# Patient Record
Sex: Female | Born: 1985 | Race: White | Hispanic: No | Smoking: Current every day smoker
Health system: Southern US, Community
[De-identification: ages and names within clinical notes are randomized; demographics above are authoritative.]

## PROBLEM LIST (undated history)

## (undated) DIAGNOSIS — F4310 Post-traumatic stress disorder, unspecified: Secondary | ICD-10-CM

## (undated) DIAGNOSIS — F419 Anxiety disorder, unspecified: Secondary | ICD-10-CM

## (undated) DIAGNOSIS — M549 Dorsalgia, unspecified: Secondary | ICD-10-CM

## (undated) DIAGNOSIS — G43909 Migraine, unspecified, not intractable, without status migrainosus: Secondary | ICD-10-CM

## (undated) DIAGNOSIS — K589 Irritable bowel syndrome without diarrhea: Secondary | ICD-10-CM

## (undated) DIAGNOSIS — F1990 Other psychoactive substance use, unspecified, uncomplicated: Secondary | ICD-10-CM

## (undated) DIAGNOSIS — F329 Major depressive disorder, single episode, unspecified: Secondary | ICD-10-CM

## (undated) DIAGNOSIS — G8929 Other chronic pain: Secondary | ICD-10-CM

## (undated) DIAGNOSIS — F319 Bipolar disorder, unspecified: Secondary | ICD-10-CM

## (undated) DIAGNOSIS — J45909 Unspecified asthma, uncomplicated: Secondary | ICD-10-CM

## (undated) DIAGNOSIS — G479 Sleep disorder, unspecified: Secondary | ICD-10-CM

## (undated) DIAGNOSIS — F32A Depression, unspecified: Secondary | ICD-10-CM

## (undated) DIAGNOSIS — G5600 Carpal tunnel syndrome, unspecified upper limb: Secondary | ICD-10-CM

## (undated) HISTORY — DX: Irritable bowel syndrome without diarrhea: K58.9

## (undated) HISTORY — DX: Carpal tunnel syndrome, unspecified upper limb: G56.00

## (undated) HISTORY — PX: LITHOTRIPSY: SUR834

## (undated) HISTORY — DX: Other psychoactive substance use, unspecified, uncomplicated: F19.90

## (undated) HISTORY — PX: TONSILLECTOMY: SUR1361

---

## 2013-10-30 ENCOUNTER — Emergency Department (HOSPITAL_COMMUNITY)
Admission: EM | Admit: 2013-10-30 | Discharge: 2013-10-30 | Disposition: A | Discharge status: Home / Self Care | Payer: Self-pay | Attending: Emergency Medicine | Admitting: Emergency Medicine

## 2013-10-30 ENCOUNTER — Encounter (HOSPITAL_COMMUNITY): Payer: Self-pay | Admitting: Emergency Medicine

## 2013-10-30 DIAGNOSIS — F17200 Nicotine dependence, unspecified, uncomplicated: Secondary | ICD-10-CM | POA: Insufficient documentation

## 2013-10-30 DIAGNOSIS — F1110 Opioid abuse, uncomplicated: Secondary | ICD-10-CM | POA: Insufficient documentation

## 2013-10-30 DIAGNOSIS — F1310 Sedative, hypnotic or anxiolytic abuse, uncomplicated: Secondary | ICD-10-CM | POA: Insufficient documentation

## 2013-10-30 DIAGNOSIS — F1910 Other psychoactive substance abuse, uncomplicated: Secondary | ICD-10-CM

## 2013-10-30 DIAGNOSIS — Z79899 Other long term (current) drug therapy: Secondary | ICD-10-CM | POA: Insufficient documentation

## 2013-10-30 LAB — COMPREHENSIVE METABOLIC PANEL
ALBUMIN: 3.8 g/dL (ref 3.5–5.2)
ALT: 14 U/L (ref 0–35)
AST: 19 U/L (ref 0–37)
Alkaline Phosphatase: 66 U/L (ref 39–117)
BUN: 13 mg/dL (ref 6–23)
CHLORIDE: 101 meq/L (ref 96–112)
CO2: 24 meq/L (ref 19–32)
CREATININE: 0.9 mg/dL (ref 0.50–1.10)
Calcium: 9.2 mg/dL (ref 8.4–10.5)
GFR calc Af Amer: >90 mL/min (ref >90)
GFR calc non Af Amer: 86 mL/min — ABNORMAL LOW (ref >90)
Glucose, Bld: 52 mg/dL — ABNORMAL LOW (ref 70–99)
Potassium: 3.8 mEq/L (ref 3.7–5.3)
Sodium: 139 mEq/L (ref 137–147)
Total Protein: 7.6 g/dL (ref 6.0–8.3)

## 2013-10-30 LAB — CBC WITH DIFFERENTIAL/PLATELET
Basophils Absolute: 0 10*3/uL (ref 0.0–0.1)
Basophils Relative: 0 % (ref 0–1)
EOS ABS: 0.3 10*3/uL (ref 0.0–0.7)
EOS PCT: 3 % (ref 0–5)
HCT: 36.8 % (ref 36.0–46.0)
Hemoglobin: 12.4 g/dL (ref 12.0–15.0)
LYMPHS PCT: 29 % (ref 12–46)
Lymphs Abs: 2.9 10*3/uL (ref 0.7–4.0)
MCH: 31.4 pg (ref 26.0–34.0)
MCHC: 33.7 g/dL (ref 30.0–36.0)
MCV: 93.2 fL (ref 78.0–100.0)
Monocytes Absolute: 0.8 10*3/uL (ref 0.1–1.0)
Monocytes Relative: 8 % (ref 3–12)
Neutro Abs: 6 10*3/uL (ref 1.7–7.7)
Neutrophils Relative %: 60 % (ref 43–77)
Platelets: 300 10*3/uL (ref 150–400)
RBC: 3.95 MIL/uL (ref 3.87–5.11)
RDW: 14 % (ref 11.5–15.5)
WBC: 10 10*3/uL (ref 4.0–10.5)

## 2013-10-30 LAB — RAPID URINE DRUG SCREEN, HOSP PERFORMED
Amphetamines: NOT DETECTED
Barbiturates: NOT DETECTED
Benzodiazepines: NOT DETECTED
COCAINE: NOT DETECTED
OPIATES: NOT DETECTED
Tetrahydrocannabinol: NOT DETECTED

## 2013-10-30 LAB — CBG MONITORING, ED: GLUCOSE-CAPILLARY: 99 mg/dL (ref 70–99)

## 2013-10-30 LAB — ETHANOL

## 2013-10-30 MED ORDER — CLONIDINE HCL 0.1 MG PO TABS
0.1000 mg | ORAL_TABLET | Freq: Once | ORAL | Status: AC
  Administered 2013-10-30: 0.1 mg via ORAL
  Filled 2013-10-30: qty 1

## 2013-10-30 NOTE — ED Provider Notes (Addendum)
CSN: [id number]     Arrival date & time 10/30/13  1110 History   First MD Initiated Contact with Patient 10/30/13 1342     Chief Complaint  Patient presents with  . V70.1     (Consider location/radiation/quality/duration/timing/severity/associated sxs/prior Treatment) The history is provided by the patient.   28 year old female sent in by day Dominique Bennett for detox screening and medical clearance. Patient's had a long history of abusing opioid-type pills and benzodiazepines. Patient currently is abusing Xanax and suboccipital. However that has a Narcan component to it so not sure how she would have withdrawal from that. Patient recently was in detox [facility]. Patient currently without any symptoms last used yesterday. Patient is very worried about getting sick states to get sick when she stops. Patient denies any suicidal or homicidal ideation.  History reviewed. No pertinent past medical history. Past Surgical History  Procedure Laterality Date  . Tonsillectomy    . Nephrectomy transplanted organ    . Cesarean section     History reviewed. No pertinent family history. History  Substance Use Topics  . Smoking status: Current Every Day Smoker -- 2.00 packs/day    Types: Cigarettes  . Smokeless tobacco: Not on file  . Alcohol Use: No   OB History   Grav Para Term Preterm Abortions TAB SAB Ect Mult Living                 Review of Systems  Constitutional: Negative for fever.  HENT: Negative for congestion.   Eyes: Negative for visual disturbance.  Respiratory: Negative for shortness of breath.   Cardiovascular: Negative for chest pain.  Gastrointestinal: Negative for nausea, vomiting, abdominal pain and diarrhea.  Genitourinary: Negative for dysuria.  Musculoskeletal: Negative for back pain.  Neurological: Negative for headaches.  Hematological: Does not bruise/bleed easily.  Psychiatric/Behavioral: Negative for confusion.      Allergies  Cymbalta; Other;  and Tramadol  Home Medications   Prior to Admission medications   Medication Sig Start Date End Date Taking? Authorizing Provider  ALPRAZolam Prudy Feeler(XANAX) 1 MG tablet Take 1 mg by mouth 3 (three) times daily as needed for anxiety.   Yes Historical Provider, MD  Buprenorphine HCl-Naloxone HCl (SUBOXONE) 4-1 MG FILM Place under the tongue.   Yes Historical Provider, MD   BP 118/70  Pulse 89  Temp(Src) 97.8 F (36.6 C) (Oral)  Ht 5\' 5"  (1.651 m)  Wt 145 lb (65.772 kg)  BMI 24.13 kg/m2  SpO2 97%  LMP 10/25/2013 Physical Exam  Nursing note and vitals reviewed. Constitutional: She is oriented to person, place, and time. She appears well-developed and well-nourished. No distress.  HENT:  Head: Normocephalic and atraumatic.  Eyes: Conjunctivae are normal. Pupils are equal, round, and reactive to light.  Neck: Normal range of motion.  Cardiovascular: Normal rate, regular rhythm and normal heart sounds.   No murmur heard. Pulmonary/Chest: Effort normal and breath sounds normal. No respiratory distress.  Abdominal: Soft. Bowel sounds are normal. There is no tenderness.  Musculoskeletal: Normal range of motion. She exhibits no edema.  Neurological: She is oriented to person, place, and time. No cranial nerve deficit. She exhibits normal muscle tone. Coordination normal.  Skin: Skin is warm. No rash noted.    ED Course  Procedures (including critical care time) Labs Review Labs Reviewed  COMPREHENSIVE METABOLIC PANEL - Abnormal; Notable for the following:    Glucose, Bld 52 (*)    Total Bilirubin <0.2 (*)    GFR calc  non Af Amer 86 (*)    All other components within normal limits  URINE RAPID DRUG SCREEN (HOSP PERFORMED)  CBC WITH DIFFERENTIAL  ETHANOL   Results for orders placed during the hospital encounter of 10/30/13  URINE RAPID DRUG SCREEN (HOSP PERFORMED)      Result Value Ref Range   Opiates NONE DETECTED  NONE DETECTED   Cocaine NONE DETECTED  NONE DETECTED    Benzodiazepines NONE DETECTED  NONE DETECTED   Amphetamines NONE DETECTED  NONE DETECTED   Tetrahydrocannabinol NONE DETECTED  NONE DETECTED   Barbiturates NONE DETECTED  NONE DETECTED  COMPREHENSIVE METABOLIC PANEL      Result Value Ref Range   Sodium 139  137 - 147 mEq/L   Potassium 3.8  3.7 - 5.3 mEq/L   Chloride 101  96 - 112 mEq/L   CO2 24  19 - 32 mEq/L   Glucose, Bld 52 (*) 70 - 99 mg/dL   BUN 13  6 - 23 mg/dL   Creatinine, Ser 1.610.90  0.50 - 1.10 mg/dL   Calcium 9.2  8.4 - 09.610.5 mg/dL   Total Protein 7.6  6.0 - 8.3 g/dL   Albumin 3.8  3.5 - 5.2 g/dL   AST 19  0 - 37 U/L   ALT 14  0 - 35 U/L   Alkaline Phosphatase 66  39 - 117 U/L   Total Bilirubin <0.2 (*) 0.3 - 1.2 mg/dL   GFR calc non Af Amer 86 (*) >90 mL/min   GFR calc Af Amer >90  >90 mL/min  CBC WITH DIFFERENTIAL      Result Value Ref Range   WBC 10.0  4.0 - 10.5 K/uL   RBC 3.95  3.87 - 5.11 MIL/uL   Hemoglobin 12.4  12.0 - 15.0 g/dL   HCT 04.536.8  40.936.0 - 81.146.0 %   MCV 93.2  78.0 - 100.0 fL   MCH 31.4  26.0 - 34.0 pg   MCHC 33.7  30.0 - 36.0 g/dL   RDW 91.414.0  78.211.5 - 95.615.5 %   Platelets 300  150 - 400 K/uL   Neutrophils Relative % 60  43 - 77 %   Neutro Abs 6.0  1.7 - 7.7 K/uL   Lymphocytes Relative 29  12 - 46 %   Lymphs Abs 2.9  0.7 - 4.0 K/uL   Monocytes Relative 8  3 - 12 %   Monocytes Absolute 0.8  0.1 - 1.0 K/uL   Eosinophils Relative 3  0 - 5 %   Eosinophils Absolute 0.3  0.0 - 0.7 K/uL   Basophils Relative 0  0 - 1 %   Basophils Absolute 0.0  0.0 - 0.1 K/uL  ETHANOL      Result Value Ref Range   Alcohol, Ethyl (B) <11  0 - 11 mg/dL     Imaging Review No results found.   EKG Interpretation None      MDM   Final diagnoses:  Polysubstance abuse    Patient sent in by day Dominique Bennett for a medical screening to go to detox program. Patient has been abusing opioid type medications and benzodiazepines for a long time. Patient recently in [facility] about 3 weeks ago. Patient states  she's abusing Xanax and suboxone.   We'll have behavioral health talk to the patient. She has been medically cleared. Patient was requesting some clonidine last used yesterday. No suicidal or homicidal thoughts.    Vanetta MuldersScott Santa Abdelrahman, MD 10/30/13 1454  Vanetta Mulders, MD 10/30/13 [phone]
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
## 2013-10-30 NOTE — Discharge Instructions (Signed)
°Emergency Department Resource Guide °1) Find a Doctor and Pay Out of Pocket °Although you won't have to find out who is covered by your insurance plan, it is a good idea to ask around and get recommendations. You will then need to call the office and see if the doctor you have chosen will accept you as a new patient and what types of options they offer for patients who are self-pay. Some doctors offer discounts or will set up payment plans for their patients who do not have insurance, but you will need to ask so you aren't surprised when you get to your appointment. ° °2) Contact Your Local Health Department °Not all health departments have doctors that can see patients for sick visits, but many do, so it is worth a call to see if yours does. If you don't know where your local health department is, you can check in your phone book. The CDC also has a tool to help you locate your state's health department, and many state websites also have listings of all of their local health departments. ° °3) Find a Walk-in Clinic °If your illness is not likely to be very severe or complicated, you may want to try a walk in clinic. These are popping up all over the country in pharmacies, drugstores, and shopping centers. They're usually staffed by nurse practitioners or physician assistants that have been trained to treat common illnesses and complaints. They're usually fairly quick and inexpensive. However, if you have serious medical issues or chronic medical problems, these are probably not your best option. ° °No Primary Care Doctor: °- Call [facility] at  832-8000 - they can help you locate a primary care doctor that  accepts your insurance, provides certain services, etc. °- Physician Referral Service- 1-800-533-3463 ° °Chronic Pain Problems: °Organization         Address  Phone   Notes  °[facility]  (336) 297-2271 Patients need to be referred by their primary care doctor.  ° °Medication  Assistance: °Organization         Address  Phone   Notes  °[place] [facility] [address] °[place], [place] [address] (336) 641-8030 --Must be a resident of [place] °-- Must have NO insurance coverage whatsoever (no Medicaid/ Medicare, etc.) °-- The pt. MUST have a primary care doctor that directs their care regularly and follows them in the community °  °[facility]  (866) 331-1348   °[employer]  (888) 892-1162   ° °Agencies that provide inexpensive medical care: °Organization         Address  Phone   Notes  °[facility]  (336) 832-8035   °[facility]    (336) 832-7272   °[facility] [address] °[place], [place] [address] (336) 832-4777   °[facility] [address], °[place] (336) 271-4999   °[facility]    (336) 373-0678   °[facility]    (336) 272-1050   °[facility] ° [address], [place] Phone:  (336) 832-4444, Fax:  (336) 832-4440 Hours of Operation:  9 am - 6 pm, M-F.  Also accepts Medicaid/Medicare and self-pay.  °[facility] ° [address],  Phone: (336) 832-3150, Fax: (336) 832-3151. Hours of Operation:  8:30 am - 5:30 pm, M-F.  Also accepts Medicaid and self-pay.  °[facility] [place] [address], [place] Phone: (336) 878-6027   °[facility] [address], [place], [place] (336)723-1848, Ext. 123 Mondays & Thursdays: 7-9 AM.  First 15 patients are seen on a first come, first serve basis. °  ° °Medicaid-accepting [place] Providers: ° °Organization         Address  Phone   Notes  °[facility] [address], [place] (336) 641-2100 Also accepts self-pay patients.  °[facility] [address], [place] ° (336) 856-9996   °[facility] [address], [place]  (336) 288-8857   °[facility] [address] [place] [address], [place] (336) 299-7000   °Veita Bland [address], [place]  ° (336) 373-1557 Only accepts [place] Access Medicaid patients after they have their name applied to their card.  ° °Self-Pay (no insurance) in [place]: ° °Organization         Address  Phone   Notes  °Sickle Cell Patients, [facility] [address], [place] (336) 832-1970   °[facility] [address], [place] (336) 832-4400   °[facility] [place] ° [address],  (336) 992-4800   °[facility]/Dr. Osei-Bonsu ° [address] [place] [address], [place] or [address], [place] (336) 841-8500 Phone number for both [place] and [place] locations is the same.  °[facility] [address], [place] (336) 299-0000   °[facility] [address] [place] [address], [place] or [address] (336) 852-7530 °(336) 878-2260   °[facility] [address], [place] (336) 350-1642, phone; (336) 294-5005, fax Sees patients 1st and 3rd Saturday of every month.  Must not qualify for public or private insurance (i.e. Medicaid, Medicare, [place] Health Choice, Veterans' Benefits) • Household income should be no more than 200% of the poverty level •The clinic cannot treat you if you are pregnant or think you are pregnant • Sexually transmitted diseases are not treated at the clinic.  ° ° °Dental Care: °Organization         Address  Phone  Notes  °[place] [facility] [address],  (336) 641-6152 Accepts children up to age 21 who are enrolled in Medicaid or [place] Health Choice; pregnant women with a Medicaid card; and children who have applied for Medicaid or [place] Health Choice, but were declined, whose parents can pay a reduced fee at time of service.  °[place]  [facility] [place]  [address], [place] (336) 641-7733 Accepts children up to age 21 who are enrolled in Medicaid or [place] Health Choice; pregnant women with a Medicaid card; and children who have applied for Medicaid or [place] Health Choice, but were declined, whose parents can pay a reduced fee at time of service.  °[facility] ° [address],  (336) 641-4533 Patients are seen by appointment only. Walk-ins are not accepted. [facility] will see patients 18 years of age and older. °Monday - Tuesday (8am-5pm) °Most Wednesdays (8:30-5pm) °$30 per visit, cash only  °[facility] ° [address], [place] (336) 641-4533 Patients are seen by appointment only. Walk-ins are not accepted. [facility] will see patients 18 years of age and older. °One   Wednesday Evening (Monthly: Volunteer Based).  $30 per visit, cash only  °[facility]  (919) 537-3737 for adults; Children under age 4, call Graduate Pediatric Dentistry at ([phone]) [phone]. Children aged 4-14, please call (919) [phone] to request a pediatric application. ° Dental services are provided in all areas of dental care including fillings, crowns and bridges, complete and partial dentures, implants, gum treatment, root canals, and extractions. Preventive care is also provided. Treatment is provided to both adults and children. °Patients are selected via a lottery and there is often a waiting list. °  °[facility] [address], °[place] ° (336) 763-8833 [email or web address] °  °[facility] [address], [place], [place] (336)723-1848, Ext. 123 Second and Fourth Thursday of each month, opens at 6:30 AM; Clinic ends at 9 AM.  Patients are seen on a first-come first-served basis, and a limited number are seen during each clinic.  ° °[facility] ° [address], [place], [place] (336) 723-7904    Eligibility Requirements °You must have lived in [place], [place], or [place] counties for at least the last three months. °  You cannot be eligible for state or federal sponsored healthcare insurance, including Veterans Administration, Medicaid, or Medicare. °  You generally cannot be eligible for healthcare insurance through your employer.  °  How to apply: °Eligibility screenings are held every Tuesday and Wednesday afternoon from 1:00 pm until 4:00 pm. You do not need an appointment for the interview!  °[facility] [address], [place], [place] 336-631-2330   °[facility] [facility]  336-342-8273   °[place]  336-703-3100   °[facility]  336-570-6415   ° °Behavioral Health Resources in the Community: °Intensive Outpatient Programs °Organization         Address  Phone  Notes  °[facility] [address], [place], [place] 336-878-6098   °[facility] [address], [place], [place] 336-832-9800   °[facility] [address], [place], [place] ° 336-882-2125   °[facility] [address],  °[place], [place] 1-800-853-5163 or 336-641-4981   °Substance Abuse Resources °Organization         Address  Phone  Notes  °[facility]  336-882-2125   °[facility]  336-784-9470   °[facility]  336-285-9073   °[facility]  336-845-3988   °Residential & Outpatient Substance Abuse Program  1-800-659-3381   °Psychological Services °Organization         Address  Phone  Notes  ° Health  336- 832-9600   °[facility]  336- 378-7881   °[facility] [address], [place] 1-800-853-5163 or 336-641-4981   ° °Mobile Crisis Teams °Organization         Address  Phone  Notes  °[facility], Mobile Crisis Care Unit  1-877-626-1772   °Assertive °[facility] ° [address]  [place], [place] 336-834-9664   °Sharon DeEsch [address] °[place] 336-554-5454   ° °Self-Help/Support Groups °Organization         Address  Phone             Notes  °[facility]  - variety of support groups  336- 373-1402 Call for more information  °Narcotics Anonymous (NA), [facility] [address], °[place]  2 meetings at this location  ° °  Residential Treatment Programs °Organization         Address  Phone  Notes  °[facility] [address],    °[place]  1-866-801-8205   °[facility] ° [address], [place], [place] 704-293-8524   °[facility] [address], [place] 336-845-3988 Admissions: 8am-3pm M-F  °[facility] [address]    °[place], [place] 336-841-1104   °[facility] [address], [place], [place] 336-379-7146   °[facility] [address]  °[place], [place] 336-285-9073   °[facility] - Intensive Outpatient [address], [place], [place] 336-852-3033   °[facility] [address]  °[place], [place] 1-877-615-2722 or 336-784-9470   °[facility] [address] [place], [place] 336-227-7417 Accepts Medicaid  °[facility] [address]  °[place] 1-800-659-3381 Substance Abuse/Addiction Treatment  ° °[place] Behavioral Health Resources °Organization         Address  Phone  Notes  °[facility]  (888) 581-9988   °Julie Brannon, PhD [address] [place], [place]   (336) 349-5553 or (336) 951-0000   °[facility]   [address] °[place], [place] (336) 349-4454   °[facility] [address], [place], [place] (336) 342-8316 Insurance/Medicaid/sponsorship through [facility]  °[facility] [address]                                    [place], [place] (336) 342-8316 Therapy/tele-psych/case    °[facility] [address]  ° [place], [place] (336) 349-2233    °Dr. Arfeen  (336) 349-4544   °[facility] [place]  [employer] [place] [facility] 1) [address], [place] °2) [address], [place] °3)  [address], [place] (336) 349-3220 °(336) 342-7768 ° °(336) 342-8140   °[place] [facility] (336) 342-1394 or (336) 342-3537 (After Hours)    ° ° °

## 2013-10-30 NOTE — BH Assessment (Signed)
Spoke with EDP Dr.Ray to obtain clinicals prior to assessing patient.  Glorious PeachNajah Jumaane [place]ford, MS, LCASA Assessment Counselor

## 2013-10-30 NOTE — BH Assessment (Signed)
Consulted with AC Shelbie Proctorina Lawson and Theotis Burrowonrad Winthrow,FNP who is declining patient for inpatient detox admission as patient does not meet inpatient criteria.   Pt is recommended to follow back up with [facility] so they can coordinate residential long term placement for patient. TTS informed patient of this and she is agreeable to follow-up with [facility].  Consulted with EDP Dr. Rosalia Hammersay and she is in agreement with discharging patient from ER with the [facility].   Glorious PeachNajah Presley, MS, LCASA Assessment Counselor

## 2013-10-30 NOTE — BH Assessment (Signed)
TTS assessment complete.  Najah Presley, MS, LCASA Assessment Counselor  

## 2013-10-30 NOTE — ED Notes (Signed)
Patient given discharge instruction, verbalized understand. Patient ambulatory out of the department. Returned pt belonging, and pickup wallet from security,

## 2013-10-30 NOTE — ED Notes (Signed)
Being xanax and suboxone

## 2013-10-30 NOTE — ED Notes (Signed)
Pt reports that her last drug use was yesterday. Pt reports taking 2 xanax and 2 suboxone. Pt denies taking any other drugs.

## 2013-10-30 NOTE — ED Notes (Signed)
Pt states she does not want anything for pain, she wants to go home, [facility]

## 2013-10-30 NOTE — ED Provider Notes (Signed)
28 year old female who presents with report of substance abuse. [facility] need for medical clearance. Medical clearance was done here and a psychiatric consult was obtained. The pill she does not meet inpatient criteria. They have spoken with her and reports that she understands and is given community resources for followup to  Hilario Quarryanielle S Ray, MD 10/30/13 1948

## 2014-02-16 ENCOUNTER — Emergency Department (HOSPITAL_COMMUNITY)
Admission: EM | Admit: 2014-02-16 | Discharge: 2014-02-16 | Disposition: A | Discharge status: Home / Self Care | Payer: Self-pay | Attending: Emergency Medicine | Admitting: Emergency Medicine

## 2014-02-16 ENCOUNTER — Encounter (HOSPITAL_COMMUNITY): Payer: Self-pay | Admitting: Emergency Medicine

## 2014-02-16 ENCOUNTER — Emergency Department (HOSPITAL_COMMUNITY): Payer: Self-pay

## 2014-02-16 DIAGNOSIS — R109 Unspecified abdominal pain: Secondary | ICD-10-CM

## 2014-02-16 DIAGNOSIS — J45909 Unspecified asthma, uncomplicated: Secondary | ICD-10-CM | POA: Insufficient documentation

## 2014-02-16 DIAGNOSIS — Z720 Tobacco use: Secondary | ICD-10-CM | POA: Insufficient documentation

## 2014-02-16 DIAGNOSIS — Z8679 Personal history of other diseases of the circulatory system: Secondary | ICD-10-CM | POA: Insufficient documentation

## 2014-02-16 DIAGNOSIS — Z87442 Personal history of urinary calculi: Secondary | ICD-10-CM | POA: Insufficient documentation

## 2014-02-16 DIAGNOSIS — N12 Tubulo-interstitial nephritis, not specified as acute or chronic: Secondary | ICD-10-CM | POA: Insufficient documentation

## 2014-02-16 DIAGNOSIS — G8929 Other chronic pain: Secondary | ICD-10-CM | POA: Insufficient documentation

## 2014-02-16 DIAGNOSIS — Z91040 Latex allergy status: Secondary | ICD-10-CM | POA: Insufficient documentation

## 2014-02-16 DIAGNOSIS — Z9889 Other specified postprocedural states: Secondary | ICD-10-CM | POA: Insufficient documentation

## 2014-02-16 DIAGNOSIS — Z3202 Encounter for pregnancy test, result negative: Secondary | ICD-10-CM | POA: Insufficient documentation

## 2014-02-16 HISTORY — DX: Dorsalgia, unspecified: M54.9

## 2014-02-16 HISTORY — DX: Migraine, unspecified, not intractable, without status migrainosus: G43.909

## 2014-02-16 HISTORY — DX: Unspecified asthma, uncomplicated: J45.909

## 2014-02-16 HISTORY — DX: Other chronic pain: G89.29

## 2014-02-16 LAB — URINALYSIS, ROUTINE W REFLEX MICROSCOPIC
Glucose, UA: NEGATIVE mg/dL
Ketones, ur: 15 mg/dL — AB
Leukocytes, UA: NEGATIVE
Nitrite: POSITIVE — AB
Protein, ur: 100 mg/dL — AB
Specific Gravity, Urine: >1.03 — ABNORMAL HIGH (ref 1.005–1.030)
Urobilinogen, UA: 0.2 mg/dL (ref 0.0–1.0)
pH: 6 (ref 5.0–8.0)

## 2014-02-16 LAB — CBC WITH DIFFERENTIAL/PLATELET
Basophils Absolute: 0 10*3/uL (ref 0.0–0.1)
Basophils Relative: 0 % (ref 0–1)
Eosinophils Absolute: 0 10*3/uL (ref 0.0–0.7)
Eosinophils Relative: 0 % (ref 0–5)
HCT: 40.3 % (ref 36.0–46.0)
Hemoglobin: 14 g/dL (ref 12.0–15.0)
Lymphocytes Relative: 19 % (ref 12–46)
Lymphs Abs: 2.1 10*3/uL (ref 0.7–4.0)
MCH: 33.4 pg (ref 26.0–34.0)
MCHC: 34.7 g/dL (ref 30.0–36.0)
MCV: 96.2 fL (ref 78.0–100.0)
Monocytes Absolute: 0.7 10*3/uL (ref 0.1–1.0)
Monocytes Relative: 6 % (ref 3–12)
Neutro Abs: 7.9 10*3/uL — ABNORMAL HIGH (ref 1.7–7.7)
Neutrophils Relative %: 75 % (ref 43–77)
Platelets: 297 10*3/uL (ref 150–400)
RBC: 4.19 MIL/uL (ref 3.87–5.11)
RDW: 12.6 % (ref 11.5–15.5)
WBC: 10.8 10*3/uL — ABNORMAL HIGH (ref 4.0–10.5)

## 2014-02-16 LAB — BASIC METABOLIC PANEL
Anion gap: 15 (ref 5–15)
BUN: 9 mg/dL (ref 6–23)
CALCIUM: 9.7 mg/dL (ref 8.4–10.5)
CO2: 24 mEq/L (ref 19–32)
Chloride: 100 mEq/L (ref 96–112)
Creatinine, Ser: 0.75 mg/dL (ref 0.50–1.10)
GFR calc Af Amer: >90 mL/min (ref >90)
GFR calc non Af Amer: >90 mL/min (ref >90)
GLUCOSE: 106 mg/dL — AB (ref 70–99)
Potassium: 3.2 mEq/L — ABNORMAL LOW (ref 3.7–5.3)
SODIUM: 139 meq/L (ref 137–147)

## 2014-02-16 LAB — URINE MICROSCOPIC-ADD ON

## 2014-02-16 LAB — PREGNANCY, URINE: PREG TEST UR: NEGATIVE

## 2014-02-16 MED ORDER — ONDANSETRON HCL 4 MG PO TABS: 4.0000 mg | ORAL_TABLET | Freq: Four times a day (QID) | ORAL | Status: DC

## 2014-02-16 MED ORDER — OXYCODONE-ACETAMINOPHEN 5-325 MG PO TABS
1.0000 | ORAL_TABLET | Freq: Once | ORAL | Status: AC
  Administered 2014-02-16: 1 via ORAL
  Filled 2014-02-16: qty 1

## 2014-02-16 MED ORDER — IBUPROFEN 800 MG PO TABS: 800.0000 mg | ORAL_TABLET | Freq: Three times a day (TID) | ORAL | Status: DC

## 2014-02-16 MED ORDER — MORPHINE SULFATE 4 MG/ML IJ SOLN
4.0000 mg | Freq: Once | INTRAMUSCULAR | Status: AC
  Administered 2014-02-16: 4 mg via INTRAVENOUS
  Filled 2014-02-16: qty 1

## 2014-02-16 MED ORDER — ONDANSETRON HCL 4 MG/2ML IJ SOLN
4.0000 mg | Freq: Once | INTRAMUSCULAR | Status: AC
  Administered 2014-02-16: 4 mg via INTRAVENOUS
  Filled 2014-02-16: qty 2

## 2014-02-16 MED ORDER — DEXTROSE 5 % IV SOLN
1.0000 g | Freq: Once | INTRAVENOUS | Status: AC
  Administered 2014-02-16: 1 g via INTRAVENOUS
  Filled 2014-02-16: qty 10

## 2014-02-16 MED ORDER — HYDROMORPHONE HCL 1 MG/ML IJ SOLN
1.0000 mg | Freq: Once | INTRAMUSCULAR | Status: AC
  Administered 2014-02-16: 1 mg via INTRAVENOUS
  Filled 2014-02-16: qty 1

## 2014-02-16 MED ORDER — CEPHALEXIN 500 MG PO CAPS: 500.0000 mg | ORAL_CAPSULE | Freq: Four times a day (QID) | ORAL | Status: DC

## 2014-02-16 MED ORDER — OXYCODONE-ACETAMINOPHEN 5-325 MG PO TABS: 2.0000 | ORAL_TABLET | ORAL | Status: DC | PRN

## 2014-02-16 MED ORDER — SODIUM CHLORIDE 0.9 % IV BOLUS (SEPSIS)
1000.0000 mL | Freq: Once | INTRAVENOUS | Status: AC
Start: 2014-02-16 — End: 2014-02-16
  Administered 2014-02-16: 1000 mL via INTRAVENOUS

## 2014-02-16 NOTE — ED Notes (Signed)
Right flank pain since waking at 1030 today. Hx of kidney stones with same pain in the past. PT also states nausea, denies vomiting.

## 2014-02-16 NOTE — Discharge Instructions (Signed)
Pyelonephritis, Adult Take the antibiotics as prescribed.  Follow up with your doctor. Return to the ED if you develop new or worsening symptoms. Pyelonephritis is a kidney infection. In general, there are 2 main types of pyelonephritis:  Infections that come on quickly without any warning (acute pyelonephritis).  Infections that persist for a long period of time (chronic pyelonephritis). CAUSES  Two main causes of pyelonephritis are:  Bacteria traveling from the bladder to the kidney. This is a problem especially in pregnant women. The urine in the bladder can become filled with bacteria from multiple causes, including:  Inflammation of the prostate gland (prostatitis).  Sexual intercourse in females.  Bladder infection (cystitis).  Bacteria traveling from the bloodstream to the tissue part of the kidney. Problems that may increase your risk of getting a kidney infection include:  Diabetes.  Kidney stones or bladder stones.  Cancer.  Catheters placed in the bladder.  Other abnormalities of the kidney or ureter. SYMPTOMS   Abdominal pain.  Pain in the side or flank area.  Fever.  Chills.  Upset stomach.  Blood in the urine (dark urine).  Frequent urination.  Strong or persistent urge to urinate.  Burning or stinging when urinating. DIAGNOSIS  Your caregiver may diagnose your kidney infection based on your symptoms. A urine sample may also be taken. TREATMENT  In general, treatment depends on how severe the infection is.   If the infection is mild and caught early, your caregiver may treat you with oral antibiotics and send you home.  If the infection is more severe, the bacteria may have gotten into the bloodstream. This will require intravenous (IV) antibiotics and a hospital stay. Symptoms may include:  High fever.  Severe flank pain.  Shaking chills.  Even after a hospital stay, your caregiver may require you to be on oral antibiotics for a period of  time.  Other treatments may be required depending upon the cause of the infection. HOME CARE INSTRUCTIONS   Take your antibiotics as directed. Finish them even if you start to feel better.  Make an appointment to have your urine checked to make sure the infection is gone.  Drink enough fluids to keep your urine clear or pale yellow.  Take medicines for the bladder if you have urgency and frequency of urination as directed by your caregiver. SEEK IMMEDIATE MEDICAL CARE IF:   You have a fever or persistent symptoms for more than 2-3 days.  You have a fever and your symptoms suddenly get worse.  You are unable to take your antibiotics or fluids.  You develop shaking chills.  You experience extreme weakness or fainting.  There is no improvement after 2 days of treatment. MAKE SURE YOU:  Understand these instructions.  Will watch your condition.  Will get help right away if you are not doing well or get worse. Document Released: 04/20/2005 Document Revised: 10/20/2011 Document Reviewed: 09/24/2010 [facility] Patient Information 2015 [employer] [place]. This information is not intended to replace advice given to you by your health care provider. Make sure you discuss any questions you have with your health care provider.

## 2014-02-16 NOTE — ED Provider Notes (Signed)
CSN: [id number]     Arrival date & time 02/16/14  1347 History   First MD Initiated Contact with Patient 02/16/14 1717    This chart was scribed for No att. providers found by Marica OtterNusrat Rahman, ED Scribe. This patient was seen in room APA01/APA01 and the patient's care was started at 5:21 PM.  Chief Complaint  Patient presents with  . Flank Pain   HPI PCP: Derald MacleodGRAHAM,WILLIAM, MD HPI Comments: Lianne CureRobin Sica is a 28 y.o. female, with medical Hx noted below and significant for kidney stones and surgery for the same this past January, who presents to the Emergency Department complaining of constant right sided flank pain with associated back pain, nausea, chills and fever. Pt reports the flank pain is worsened with movement. Pt denies taking any measures at home to alleviate her Sx. Pt denies abd pain or vomiting. Pt reports she is currently in the middle of her menstrual cycle. Pt reports her last CT scan was in August at [facility]. Pt reports an allergy to Tramadol, motrin, cymbalta and latex.  Past Medical History  Diagnosis Date  . Migraines   . Asthma   . Chronic back pain    Past Surgical History  Procedure Laterality Date  . Tonsillectomy    . Cesarean section    . Lithotripsy     History reviewed. No pertinent family history. History  Substance Use Topics  . Smoking status: Current Every Day Smoker -- 2.00 packs/day    Types: Cigarettes  . Smokeless tobacco: Not on file  . Alcohol Use: No   OB History   Grav Para Term Preterm Abortions TAB SAB Ect Mult Living                 Review of Systems  A complete 10 system review of systems was obtained and all systems are negative except as noted in the HPI and PMH.    Allergies  Tramadol; Cymbalta; Other; and Latex  Home Medications   Prior to Admission medications   Medication Sig Start Date End Date Taking? Authorizing Provider  cephALEXin (KEFLEX) 500 MG capsule Take 1 capsule (500 mg total) by mouth 4 (four) times daily.  02/16/14   Glynn OctaveStephen Trachelle Low, MD  ibuprofen (ADVIL,MOTRIN) 800 MG tablet Take 1 tablet (800 mg total) by mouth 3 (three) times daily. 02/16/14   Glynn OctaveStephen Cynethia Schindler, MD  ondansetron (ZOFRAN) 4 MG tablet Take 1 tablet (4 mg total) by mouth every 6 (six) hours. 02/16/14   Glynn OctaveStephen Jaqwan Wieber, MD  oxyCODONE-acetaminophen (PERCOCET/ROXICET) 5-325 MG per tablet Take 2 tablets by mouth every 4 (four) hours as needed for severe pain. 02/16/14   Glynn OctaveStephen Tiyah Zelenak, MD   Triage Vitals: BP 131/75  Pulse 80  Temp(Src) 99 F (37.2 C) (Oral)  Resp 16  Ht 5\' 6"  (1.676 m)  Wt 147 lb (66.679 kg)  BMI 23.74 kg/m2  SpO2 100%  LMP 02/16/2014 Physical Exam  Nursing note and vitals reviewed. Constitutional: She is oriented to person, place, and time. She appears well-developed and well-nourished. No distress.  HENT:  Head: Normocephalic and atraumatic.  Mouth/Throat: Oropharynx is clear and moist. No oropharyngeal exudate.  Eyes: Conjunctivae and EOM are normal. Pupils are equal, round, and reactive to light.  Neck: Normal range of motion. Neck supple.  No meningismus.  Cardiovascular: Normal rate, regular rhythm, normal heart sounds and intact distal pulses.   No murmur heard. Pulmonary/Chest: Effort normal and breath sounds normal. No respiratory distress.  Abdominal: Soft. There is no rebound and  no guarding.  Right flank CVA pain. No pain in [place]ney's [place].   Musculoskeletal: Normal range of motion. She exhibits no edema and no tenderness.  5/5 strength in bilateral lower extremities. Ankle plantar and dorsiflexion intact. Great toe extension intact bilaterally. +2 DP and PT pulses. +2 patellar reflexes bilaterally. Normal gait.   Neurological: She is alert and oriented to person, place, and time. No cranial nerve deficit. She exhibits normal muscle tone. Coordination normal.  No ataxia on finger to nose bilaterally. No pronator drift. 5/5 strength throughout. CN 2-12 intact. Negative Romberg. Equal grip  strength. Sensation intact. Gait is normal.   Skin: Skin is warm.  Psychiatric: She has a normal mood and affect. Her behavior is normal.    ED Course  Procedures (including critical care time) DIAGNOSTIC STUDIES: Oxygen Saturation is 100% on RA, nl by my interpretation.    COORDINATION OF CARE: 5:27 PM-Discussed treatment plan which includes meds, imaging, labs and UA with pt at bedside and pt agreed to plan.   Labs Review Labs Reviewed  URINALYSIS, ROUTINE W REFLEX MICROSCOPIC - Abnormal; Notable for the following:    Color, Urine AMBER (*)    APPearance CLOUDY (*)    Specific Gravity, Urine >1.030 (*)    Hgb urine dipstick LARGE (*)    Bilirubin Urine SMALL (*)    Ketones, ur 15 (*)    Protein, ur 100 (*)    Nitrite POSITIVE (*)    All other components within normal limits  CBC WITH DIFFERENTIAL - Abnormal; Notable for the following:    WBC 10.8 (*)    Neutro Abs 7.9 (*)    All other components within normal limits  BASIC METABOLIC PANEL - Abnormal; Notable for the following:    Potassium 3.2 (*)    Glucose, Bld 106 (*)    All other components within normal limits  URINE MICROSCOPIC-ADD ON - Abnormal; Notable for the following:    Squamous Epithelial / LPF FEW (*)    Bacteria, UA MANY (*)    Crystals CA OXALATE CRYSTALS (*)    All other components within normal limits  URINE CULTURE  PREGNANCY, URINE    Imaging Review Ct Renal Stone Study  02/16/2014   CLINICAL DATA:  28 year old female with right-sided abdominal and flank pain since 10:30. History of stones post lithotripsy. Initial encounter.  EXAM: CT ABDOMEN AND PELVIS WITHOUT CONTRAST  TECHNIQUE: Multidetector CT imaging of the abdomen and pelvis was performed following the standard protocol without IV contrast.  COMPARISON:  12/18/2013.  FINDINGS: No evidence of obstructing renal or ureteral calculi. There are tiny nonobstructing bilateral renal stones.  No extra luminal bowel inflammatory process, free fluid or  free air. Specifically, no inflammation is noted surrounding the appendix or terminal ileum.  Mild fatty infiltration of the liver. Taking into account limitation by non contrast imaging, no worrisome hepatic, splenic, pancreatic, renal or adrenal lesion. No calcified gallstones.  Previously noted left adnexal mass is not appreciated on current exam. Adjacent fluid-filled bowel slightly limits evaluation.  No osseous abnormality noted.  No adenopathy.  IMPRESSION: No evidence of obstructing renal or ureteral calculi. There are tiny nonobstructing bilateral renal stones.  No extra luminal bowel inflammatory process, free fluid or free air. Specifically, no inflammation is noted surrounding the appendix or terminal ileum.  Mild fatty infiltration of the liver.  Previously noted left adnexal mass is not appreciated on current exam. Adjacent fluid-filled bowel slightly limits evaluation.   Electronically Signed   By:  Bridgett LarssonSteve  Olson M.D.   On: 02/16/2014 19:27     EKG Interpretation None      MDM   Final diagnoses:  Flank pain  Pyelonephritis   Right flank pain similar to previous kidney stones that is constant and intermittent all week. endorses hematuria and nausea but no vomiting. No fever.  UA positive for infection. Will rule out infected stone. Send urine culture. Creatinine normal.  CT with no evidence of obstructing calculi. We'll treat for pyelonephritis.  Patient tolerating by mouth in the ED. Vital stable. Pain well-controlled. Urine culture sent. We'll discharge her with antibiotics for pyelonephritis and symptom control. Return precautions discussed.  BP 123/72  Pulse 84  Temp(Src) 97.8 F (36.6 C) (Oral)  Resp 16  Ht 5\' 6"  (1.676 m)  Wt 147 lb (66.679 kg)  BMI 23.74 kg/m2  SpO2 100%  LMP 02/16/2014   I personally performed the services described in this documentation, which was scribed in my presence. The recorded information has been reviewed and is accurate.   Glynn OctaveStephen  Quinisha Mould, MD 02/17/14 [phone]

## 2014-02-19 LAB — URINE CULTURE

## 2014-09-28 ENCOUNTER — Encounter (HOSPITAL_COMMUNITY): Payer: Self-pay | Admitting: *Deleted

## 2014-09-28 ENCOUNTER — Emergency Department (HOSPITAL_COMMUNITY)
Admission: EM | Admit: 2014-09-28 | Discharge: 2014-09-29 | Disposition: A | Discharge status: Other Acute Inpatient Hospital | Payer: Self-pay | Attending: Psychiatry | Admitting: Psychiatry

## 2014-09-28 ENCOUNTER — Telehealth (HOSPITAL_COMMUNITY): Payer: Self-pay | Admitting: Licensed Clinical Social Worker

## 2014-09-28 DIAGNOSIS — Z3202 Encounter for pregnancy test, result negative: Secondary | ICD-10-CM | POA: Insufficient documentation

## 2014-09-28 DIAGNOSIS — Z792 Long term (current) use of antibiotics: Secondary | ICD-10-CM | POA: Insufficient documentation

## 2014-09-28 DIAGNOSIS — Z791 Long term (current) use of non-steroidal anti-inflammatories (NSAID): Secondary | ICD-10-CM | POA: Insufficient documentation

## 2014-09-28 DIAGNOSIS — R45851 Suicidal ideations: Secondary | ICD-10-CM

## 2014-09-28 DIAGNOSIS — F419 Anxiety disorder, unspecified: Secondary | ICD-10-CM | POA: Insufficient documentation

## 2014-09-28 DIAGNOSIS — Z720 Tobacco use: Secondary | ICD-10-CM | POA: Insufficient documentation

## 2014-09-28 DIAGNOSIS — Z23 Encounter for immunization: Secondary | ICD-10-CM | POA: Insufficient documentation

## 2014-09-28 DIAGNOSIS — Z8679 Personal history of other diseases of the circulatory system: Secondary | ICD-10-CM | POA: Insufficient documentation

## 2014-09-28 DIAGNOSIS — G8929 Other chronic pain: Secondary | ICD-10-CM | POA: Insufficient documentation

## 2014-09-28 DIAGNOSIS — F1310 Sedative, hypnotic or anxiolytic abuse, uncomplicated: Secondary | ICD-10-CM | POA: Insufficient documentation

## 2014-09-28 DIAGNOSIS — J45909 Unspecified asthma, uncomplicated: Secondary | ICD-10-CM | POA: Insufficient documentation

## 2014-09-28 DIAGNOSIS — F1110 Opioid abuse, uncomplicated: Secondary | ICD-10-CM | POA: Insufficient documentation

## 2014-09-28 DIAGNOSIS — Z91040 Latex allergy status: Secondary | ICD-10-CM | POA: Insufficient documentation

## 2014-09-28 HISTORY — DX: Depression, unspecified: F32.A

## 2014-09-28 HISTORY — DX: Major depressive disorder, single episode, unspecified: F32.9

## 2014-09-28 HISTORY — DX: Anxiety disorder, unspecified: F41.9

## 2014-09-28 HISTORY — DX: Sleep disorder, unspecified: G47.9

## 2014-09-28 HISTORY — DX: Bipolar disorder, unspecified: F31.9

## 2014-09-28 LAB — COMPREHENSIVE METABOLIC PANEL
ALK PHOS: 76 U/L (ref 38–126)
ALT: 24 U/L (ref 14–54)
AST: 26 U/L (ref 15–41)
Albumin: 4.7 g/dL (ref 3.5–5.0)
Anion gap: 10 (ref 5–15)
BUN: 13 mg/dL (ref 6–20)
CALCIUM: 9.9 mg/dL (ref 8.9–10.3)
CHLORIDE: 102 mmol/L (ref 101–111)
CO2: 24 mmol/L (ref 22–32)
Creatinine, Ser: 0.8 mg/dL (ref 0.44–1.00)
GFR calc non Af Amer: >60 mL/min (ref >60)
Glucose, Bld: 76 mg/dL (ref 65–99)
Potassium: 3.8 mmol/L (ref 3.5–5.1)
Sodium: 136 mmol/L (ref 135–145)
Total Bilirubin: 0.6 mg/dL (ref 0.3–1.2)
Total Protein: 8.8 g/dL — ABNORMAL HIGH (ref 6.5–8.1)

## 2014-09-28 LAB — CBC WITH DIFFERENTIAL/PLATELET
BASOS ABS: 0 10*3/uL (ref 0.0–0.1)
Basophils Relative: 0 % (ref 0–1)
EOS ABS: 0 10*3/uL (ref 0.0–0.7)
Eosinophils Relative: 0 % (ref 0–5)
HCT: 42.2 % (ref 36.0–46.0)
Hemoglobin: 14.3 g/dL (ref 12.0–15.0)
Lymphocytes Relative: 18 % (ref 12–46)
Lymphs Abs: 2.6 10*3/uL (ref 0.7–4.0)
MCH: 33.7 pg (ref 26.0–34.0)
MCHC: 33.9 g/dL (ref 30.0–36.0)
MCV: 99.5 fL (ref 78.0–100.0)
Monocytes Absolute: 0.8 10*3/uL (ref 0.1–1.0)
Monocytes Relative: 6 % (ref 3–12)
NEUTROS ABS: 10.8 10*3/uL — AB (ref 1.7–7.7)
NEUTROS PCT: 76 % (ref 43–77)
Platelets: 382 10*3/uL (ref 150–400)
RBC: 4.24 MIL/uL (ref 3.87–5.11)
RDW: 12.5 % (ref 11.5–15.5)
WBC: 14.2 10*3/uL — ABNORMAL HIGH (ref 4.0–10.5)

## 2014-09-28 LAB — RAPID URINE DRUG SCREEN, HOSP PERFORMED
Amphetamines: NOT DETECTED
Barbiturates: NOT DETECTED
Benzodiazepines: POSITIVE — AB
Cocaine: NOT DETECTED
Opiates: POSITIVE — AB
Tetrahydrocannabinol: NOT DETECTED

## 2014-09-28 LAB — ACETAMINOPHEN LEVEL: Acetaminophen (Tylenol), Serum: <10 ug/mL — ABNORMAL LOW (ref 10–30)

## 2014-09-28 LAB — SALICYLATE LEVEL

## 2014-09-28 LAB — ETHANOL

## 2014-09-28 MED ORDER — NICOTINE 21 MG/24HR TD PT24
21.0000 mg | MEDICATED_PATCH | Freq: Every day | TRANSDERMAL | Status: DC
  Administered 2014-09-28 – 2014-09-29 (×2): 21 mg via TRANSDERMAL
  Filled 2014-09-28 (×2): qty 1

## 2014-09-28 MED ORDER — TETANUS-DIPHTH-ACELL PERTUSSIS 5-2.5-18.5 LF-MCG/0.5 IM SUSP
0.5000 mL | Freq: Once | INTRAMUSCULAR | Status: AC
  Administered 2014-09-28: 0.5 mL via INTRAMUSCULAR
  Filled 2014-09-28: qty 0.5

## 2014-09-28 MED ORDER — ACETAMINOPHEN 325 MG PO TABS: 650.0000 mg | ORAL_TABLET | ORAL | Status: DC | PRN

## 2014-09-28 MED ORDER — ONDANSETRON HCL 4 MG PO TABS: 4.0000 mg | ORAL_TABLET | Freq: Three times a day (TID) | ORAL | Status: DC | PRN

## 2014-09-28 MED ORDER — ALUM & MAG HYDROXIDE-SIMETH 200-200-20 MG/5ML PO SUSP: 30.0000 mL | ORAL | Status: DC | PRN

## 2014-09-28 MED ORDER — OXYCODONE-ACETAMINOPHEN 5-325 MG PO TABS
1.0000 | ORAL_TABLET | Freq: Once | ORAL | Status: AC
  Administered 2014-09-28: 1 via ORAL
  Filled 2014-09-28: qty 1

## 2014-09-28 MED ORDER — IBUPROFEN 400 MG PO TABS
600.0000 mg | ORAL_TABLET | Freq: Three times a day (TID) | ORAL | Status: DC | PRN
  Administered 2014-09-28: 600 mg via ORAL
  Filled 2014-09-28: qty 2

## 2014-09-28 MED ORDER — LORAZEPAM 1 MG PO TABS
1.0000 mg | ORAL_TABLET | Freq: Once | ORAL | Status: AC
  Administered 2014-09-28: 1 mg via ORAL
  Filled 2014-09-28: qty 1

## 2014-09-28 MED ORDER — ZOLPIDEM TARTRATE 5 MG PO TABS
10.0000 mg | ORAL_TABLET | Freq: Once | ORAL | Status: AC
  Administered 2014-09-28: 10 mg via ORAL
  Filled 2014-09-28: qty 2
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
## 2014-09-28 NOTE — ED Notes (Signed)
Pt states she wants to kill herself, pt has cuts to abdomen and lt wrist, states she tried last night and once today to kill herself by cutting herself. Pt states she has been admitted for SI previously in 2015. Pt states she feels hopeless and that she has lost everything, tearful in triage, mother present in triage.

## 2014-09-28 NOTE — ED Provider Notes (Signed)
TIME SEEN: 4:40 PM  CHIEF COMPLAINT: Suicidal thoughts  HPI: Pt is a 29 y.o. female with history of migraines, asthma, chronic back pain, depression and bipolar disorder who presents to the emergency department with suicidal thoughts for the past several days. States she has never had a suicide attempt before but has had thoughts frequently. States she has been cutting herself at home. No HI or hallucinations. States she did take 2 Klonopin prior to arrival. No other drug or alcohol use per her report. She is also complaining of her chronic back pain that is unchanged. She has had this pain for the past 8 years. No new injury. No numbness, tingling or focal weakness. No bowel or bladder incontinence. No fever.  ROS: See HPI Constitutional: no fever  Eyes: no drainage  ENT: no runny nose   Cardiovascular:  no chest pain  Resp: no SOB or cough GI: no vomiting or diarrhea GU: no dysuria Integumentary: no rash  Allergy: no hives  Musculoskeletal: no leg swelling  Neurological: no slurred speech ROS otherwise negative  PAST MEDICAL HISTORY/PAST SURGICAL HISTORY:  Past Medical History  Diagnosis Date  . Migraines   . Asthma   . Chronic back pain   . Depression   . Anxiety   . Bipolar 1 disorder   . Sleep disorder     MEDICATIONS:  Prior to Admission medications   Medication Sig Start Date End Date Taking? Authorizing Provider  cephALEXin (KEFLEX) 500 MG capsule Take 1 capsule (500 mg total) by mouth 4 (four) times daily. 02/16/14   Glynn Octave, MD  ibuprofen (ADVIL,MOTRIN) 800 MG tablet Take 1 tablet (800 mg total) by mouth 3 (three) times daily. 02/16/14   Glynn Octave, MD  ondansetron (ZOFRAN) 4 MG tablet Take 1 tablet (4 mg total) by mouth every 6 (six) hours. 02/16/14   Glynn Octave, MD  oxyCODONE-acetaminophen (PERCOCET/ROXICET) 5-325 MG per tablet Take 2 tablets by mouth every 4 (four) hours as needed for severe pain. 02/16/14   Glynn Octave, MD    ALLERGIES:   Allergies  Allergen Reactions  . Tramadol Other (See Comments)    Paralysis of bladder  . Cymbalta [Duloxetine Hcl] Other (See Comments)    Altered mental status  . Other Hives    Flu vaccine  . Latex Rash    SOCIAL HISTORY:  History  Substance Use Topics  . Smoking status: Current Every Day Smoker -- 2.00 packs/day    Types: Cigarettes  . Smokeless tobacco: Not on file  . Alcohol Use: No    FAMILY HISTORY: History reviewed. No pertinent family history.  EXAM: BP 143/101 mmHg  Pulse 112  Temp(Src) 98.9 F (37.2 C) (Oral)  Resp 20  Ht  (1.676 m)  Wt 153 lb (69.4 kg)  BMI 24.71 kg/m2  SpO2 98%  LMP 09/03/2014 CONSTITUTIONAL: Alert and oriented and responds appropriately to questions. Well-appearing; well-nourished HEAD: Normocephalic EYES: Conjunctivae clear, PERRL ENT: normal nose; no rhinorrhea; moist mucous membranes; pharynx without lesions noted NECK: Supple, no meningismus, no LAD  CARD: Regular and tachycardic; S1 and S2 appreciated; no murmurs, no clicks, no rubs, no gallops RESP: Normal chest excursion without splinting or tachypnea; breath sounds clear and equal bilaterally; no wheezes, no rhonchi, no rales, no hypoxia or respiratory distress, speaking full sentences ABD/GI: Normal bowel sounds; non-distended; soft, non-tender, no rebound, no guarding, no peritoneal signs BACK:  The back appears normal and is non-tender to palpation, there is no CVA tenderness EXT: Normal ROM in  all joints; non-tender to palpation; no edema; normal capillary refill; no cyanosis, no calf tenderness or swelling    SKIN: Normal color for age and race; warm, multiple superficial lacerations to her abdomen and right wrist on the volar aspect that are hemostatic and do not require any sutures NEURO: Moves all extremities equally, sensation to light touch intact diffusely, cranial nerves II through XII intact PSYCH: Patient appears anxious, tearful, endorses SI. No HI or  hallucinations.  MEDICAL DECISION MAKING: Patient here with SI and self injury. She has multiple superficial lacerations that do not need repair. No sign of superimposed infection. Will update her tetanus. She has no new medical complaints. We'll obtain screening labs and urine and discuss with TTS.  ED PROGRESS: 6:15 PM  D/w Baxter HireKristen with TTS.  They recommend inpt treatment and are looking for placement for the patient. They may have a bed at behavioral health.  7:25 PM  Pt's screening labs show leukocytosis with left shift but she has no recent infectious symptoms. Urine pregnancy is negative per technician. Drug screen positive for opiates and benzodiazepine's. She is medically cleared and awaiting psychiatric bed.     Layla MawKristen N Katriel Cutsforth, DO 09/28/14 1926

## 2014-09-28 NOTE — ED Notes (Signed)
Urine pregnancy negative  

## 2014-09-28 NOTE — Progress Notes (Addendum)
[facility] at: [facility] - per Nicholos JohnsKathleen, can fax it. [facility] - per Thayer Ohmhris, fax referral over. [facility] - per Agustin Creearlene, fax referral. [facility] - per Britt BoozerJenn, fax referral [facility] Theresa MulliganSheron, fax referral for waitlist [facility] - per Morrie SheldonAshley, fax referral. [facility] - went to voicemail. [facility] - per Elijah Birkom, can fax it.  CSW will continue to seek placement.  Melbourne Abtsatia Maire Govan, LCSWA Disposition staff 09/28/2014 7:02 PM

## 2014-09-28 NOTE — BH Assessment (Signed)
[facility] Assessment Progress Note   Called and scheduled pt's tele assessment with this clinician.  Called EDP Ward and gathered clinical information on the pt.  Casimer LaniusKristen Oseas Detty, MS, [facility] Therapeutic Triage Specialist [facility]

## 2014-09-29 ENCOUNTER — Inpatient Hospital Stay (HOSPITAL_COMMUNITY)
Admission: AD | Admit: 2014-09-29 | Discharge: 2014-10-05 | DRG: 885 | Disposition: A | Discharge status: Home / Self Care | Payer: 59 | Source: Intra-hospital | Attending: Psychiatry | Admitting: Psychiatry

## 2014-09-29 ENCOUNTER — Encounter (HOSPITAL_COMMUNITY): Payer: Self-pay | Admitting: *Deleted

## 2014-09-29 DIAGNOSIS — R319 Hematuria, unspecified: Secondary | ICD-10-CM | POA: Insufficient documentation

## 2014-09-29 DIAGNOSIS — G8929 Other chronic pain: Secondary | ICD-10-CM | POA: Diagnosis present

## 2014-09-29 DIAGNOSIS — R45851 Suicidal ideations: Secondary | ICD-10-CM | POA: Diagnosis present

## 2014-09-29 DIAGNOSIS — F4310 Post-traumatic stress disorder, unspecified: Secondary | ICD-10-CM | POA: Diagnosis present

## 2014-09-29 DIAGNOSIS — F17210 Nicotine dependence, cigarettes, uncomplicated: Secondary | ICD-10-CM | POA: Diagnosis present

## 2014-09-29 DIAGNOSIS — F3160 Bipolar disorder, current episode mixed, unspecified: Secondary | ICD-10-CM | POA: Diagnosis not present

## 2014-09-29 DIAGNOSIS — F333 Major depressive disorder, recurrent, severe with psychotic symptoms: Secondary | ICD-10-CM | POA: Diagnosis not present

## 2014-09-29 DIAGNOSIS — F1120 Opioid dependence, uncomplicated: Secondary | ICD-10-CM | POA: Diagnosis present

## 2014-09-29 DIAGNOSIS — Z590 Homelessness: Secondary | ICD-10-CM | POA: Diagnosis not present

## 2014-09-29 DIAGNOSIS — F1329 Sedative, hypnotic or anxiolytic dependence with unspecified sedative, hypnotic or anxiolytic-induced disorder: Secondary | ICD-10-CM | POA: Diagnosis present

## 2014-09-29 DIAGNOSIS — F1910 Other psychoactive substance abuse, uncomplicated: Secondary | ICD-10-CM | POA: Diagnosis present

## 2014-09-29 DIAGNOSIS — F332 Major depressive disorder, recurrent severe without psychotic features: Secondary | ICD-10-CM | POA: Diagnosis present

## 2014-09-29 DIAGNOSIS — F329 Major depressive disorder, single episode, unspecified: Secondary | ICD-10-CM | POA: Diagnosis present

## 2014-09-29 DIAGNOSIS — F319 Bipolar disorder, unspecified: Principal | ICD-10-CM | POA: Diagnosis present

## 2014-09-29 LAB — URINALYSIS, ROUTINE W REFLEX MICROSCOPIC
Bilirubin Urine: NEGATIVE
Glucose, UA: NEGATIVE mg/dL
Ketones, ur: NEGATIVE mg/dL
NITRITE: NEGATIVE
Protein, ur: 100 mg/dL — AB
Specific Gravity, Urine: 1.017 (ref 1.005–1.030)
Urobilinogen, UA: 0.2 mg/dL (ref 0.0–1.0)
pH: 6 (ref 5.0–8.0)

## 2014-09-29 LAB — PREGNANCY, URINE: PREG TEST UR: NEGATIVE

## 2014-09-29 LAB — URINE MICROSCOPIC-ADD ON

## 2014-09-29 MED ORDER — TRAZODONE HCL 50 MG PO TABS
50.0000 mg | ORAL_TABLET | Freq: Once | ORAL | Status: AC
  Administered 2014-09-29: 50 mg via ORAL

## 2014-09-29 MED ORDER — MAGNESIUM HYDROXIDE 400 MG/5ML PO SUSP: 30.0000 mL | Freq: Every day | ORAL | Status: DC | PRN

## 2014-09-29 MED ORDER — TRAZODONE HCL 50 MG PO TABS
50.0000 mg | ORAL_TABLET | Freq: Every evening | ORAL | Status: DC | PRN
  Administered 2014-09-29: 50 mg via ORAL
  Filled 2014-09-29: qty 1

## 2014-09-29 MED ORDER — ONDANSETRON 4 MG PO TBDP
4.0000 mg | ORAL_TABLET | Freq: Four times a day (QID) | ORAL | Status: AC | PRN
  Administered 2014-09-30 – 2014-10-01 (×3): 4 mg via ORAL
  Filled 2014-09-29 (×3): qty 1

## 2014-09-29 MED ORDER — ACETAMINOPHEN 325 MG PO TABS: 650.0000 mg | ORAL_TABLET | Freq: Four times a day (QID) | ORAL | Status: DC | PRN

## 2014-09-29 MED ORDER — CHLORDIAZEPOXIDE HCL 25 MG PO CAPS: 25.0000 mg | ORAL_CAPSULE | Freq: Four times a day (QID) | ORAL | Status: AC | PRN

## 2014-09-29 MED ORDER — CHLORDIAZEPOXIDE HCL 25 MG PO CAPS
25.0000 mg | ORAL_CAPSULE | ORAL | Status: AC
  Administered 2014-10-01 – 2014-10-02 (×2): 25 mg via ORAL
  Filled 2014-09-29 (×2): qty 1

## 2014-09-29 MED ORDER — ALUM & MAG HYDROXIDE-SIMETH 200-200-20 MG/5ML PO SUSP: 30.0000 mL | ORAL | Status: DC | PRN

## 2014-09-29 MED ORDER — THIAMINE HCL 100 MG/ML IJ SOLN
100.0000 mg | Freq: Once | INTRAMUSCULAR | Status: AC
  Administered 2014-09-29: 100 mg via INTRAMUSCULAR
  Filled 2014-09-29: qty 2

## 2014-09-29 MED ORDER — LOPERAMIDE HCL 2 MG PO CAPS: 2.0000 mg | ORAL_CAPSULE | ORAL | Status: AC | PRN

## 2014-09-29 MED ORDER — CHLORDIAZEPOXIDE HCL 25 MG PO CAPS
25.0000 mg | ORAL_CAPSULE | Freq: Four times a day (QID) | ORAL | Status: AC
  Administered 2014-09-29 – 2014-09-30 (×4): 25 mg via ORAL
  Filled 2014-09-29 (×4): qty 1

## 2014-09-29 MED ORDER — VITAMIN B-1 100 MG PO TABS
100.0000 mg | ORAL_TABLET | Freq: Every day | ORAL | Status: DC
  Administered 2014-09-30 – 2014-10-05 (×6): 100 mg via ORAL
  Filled 2014-09-29 (×7): qty 1
  Filled 2014-09-29: qty 14
  Filled 2014-09-29: qty 1

## 2014-09-29 MED ORDER — HYDROXYZINE HCL 25 MG PO TABS
25.0000 mg | ORAL_TABLET | Freq: Four times a day (QID) | ORAL | Status: AC | PRN
  Administered 2014-09-29 – 2014-10-02 (×7): 25 mg via ORAL
  Filled 2014-09-29 (×7): qty 1

## 2014-09-29 MED ORDER — CHLORDIAZEPOXIDE HCL 25 MG PO CAPS
25.0000 mg | ORAL_CAPSULE | Freq: Three times a day (TID) | ORAL | Status: AC
  Administered 2014-09-30 – 2014-10-01 (×3): 25 mg via ORAL
  Filled 2014-09-29 (×3): qty 1

## 2014-09-29 MED ORDER — TRAZODONE HCL 50 MG PO TABS
ORAL_TABLET | ORAL | Status: AC
  Filled 2014-09-29: qty 1

## 2014-09-29 MED ORDER — CHLORDIAZEPOXIDE HCL 25 MG PO CAPS
25.0000 mg | ORAL_CAPSULE | Freq: Every day | ORAL | Status: AC
  Administered 2014-10-03: 25 mg via ORAL
  Filled 2014-09-29: qty 1

## 2014-09-29 NOTE — ED Notes (Signed)
Patient requesting something for pain and anxiety. Dr. Elesa MassedWard notified.

## 2014-09-29 NOTE — ED Provider Notes (Signed)
[facility] Will transfer stable.   Samuel JesterKathleen Mohammad Granade, DO 09/29/14 1236

## 2014-09-29 NOTE — ED Notes (Signed)
Patient requesting something to help her sleep. Dr. Adriana Simasook notified.

## 2014-09-29 NOTE — ED Notes (Signed)
Patient states still  Cannot sleep. Requesting something else to help her sleep. Dr. Lynelle DoctorKnapp notified.

## 2014-09-29 NOTE — ED Notes (Signed)
[facility] They are ready for her anytime. Admitting MD [place].

## 2014-09-29 NOTE — ED Notes (Signed)
Pt finished eating breakfast. Updated pt on bed placement status. Pt verbalized understanding. Pt comfortable and is resting at this time.

## 2014-09-29 NOTE — ED Notes (Signed)
[facility]  They will be here around 1230.  Nurse informed.

## 2014-09-29 NOTE — ED Notes (Signed)
Per pt, pt's mom took all belongings home.

## 2014-09-29 NOTE — Tx Team (Signed)
Initial Interdisciplinary Treatment Plan   PATIENT STRESSORS: Financial difficulties Marital or family conflict Substance abuse   PATIENT STRENGTHS: Capable of independent living General fund of knowledge   PROBLEM LIST: Problem List/Patient Goals Date to be addressed Date deferred Reason deferred Estimated date of resolution  " anxiety" 09/29/2014     " hopeless" 09/29/2014     depression 09/29/2014                                          DISCHARGE CRITERIA:  Improved stabilization in mood, thinking, and/or behavior Reduction of life-threatening or endangering symptoms to within safe limits Withdrawal symptoms are absent or subacute and managed without 24-hour nursing intervention  PRELIMINARY DISCHARGE PLAN: Outpatient therapy Placement in alternative living arrangements  PATIENT/FAMIILY INVOLVEMENT: This treatment plan has been presented to and reviewed with the patient, Dominique Bennett.  The patient and family have been given the opportunity to ask questions and make suggestions.  Leighton Parodyyson, Lucianna Ostlund M 09/29/2014, 7:39 PM

## 2014-09-29 NOTE — Progress Notes (Signed)
[facility] Group Notes:  (Nursing/MHT/Case Management/Adjunct)  Date:  09/29/2014  Time:  11:58 PM  Type of Therapy:  Psychoeducational Skills  Participation Level:  Active  Participation Quality:  Appropriate  Affect:  Appropriate  Cognitive:  Appropriate  Insight:  Appropriate  Engagement in Group:  Engaged  Modes of Intervention:  Discussion  Summary of Progress/Problems: Tonight in Wrap up group Zella BallRobin said today was her first day but that it was going pretty good until her parents came for a visit. She did not go into details about what made the visit bad but did say that brought her mood down a bit. She said overall though she was in good spirits. Madaline SavageDiamond N Dilraj Killgore 09/29/2014, 11:58 PM

## 2014-09-29 NOTE — ED Notes (Signed)
[facility] at bedside. 

## 2014-09-29 NOTE — ED Notes (Signed)
Spoke with BH. Pt still awaiting bed placement. [facility] but paperwork has been faxed out to other facilities. States he will call and follow-up. Pt informed.

## 2014-09-29 NOTE — ED Notes (Signed)
Spoke with BH. States they should have d/c at noon. States will call back with updated information. Also requested urine preg.

## 2014-09-30 ENCOUNTER — Encounter (HOSPITAL_COMMUNITY): Payer: Self-pay | Admitting: Registered Nurse

## 2014-09-30 DIAGNOSIS — R45851 Suicidal ideations: Secondary | ICD-10-CM

## 2014-09-30 DIAGNOSIS — F333 Major depressive disorder, recurrent, severe with psychotic symptoms: Secondary | ICD-10-CM | POA: Diagnosis present

## 2014-09-30 DIAGNOSIS — F1910 Other psychoactive substance abuse, uncomplicated: Secondary | ICD-10-CM

## 2014-09-30 MED ORDER — KETOROLAC TROMETHAMINE 10 MG PO TABS
10.0000 mg | ORAL_TABLET | Freq: Four times a day (QID) | ORAL | Status: DC | PRN
  Administered 2014-09-30 – 2014-10-04 (×11): 10 mg via ORAL
  Filled 2014-09-30 (×11): qty 1

## 2014-09-30 MED ORDER — GABAPENTIN 300 MG PO CAPS
300.0000 mg | ORAL_CAPSULE | Freq: Every day | ORAL | Status: DC
  Administered 2014-09-30 – 2014-10-02 (×2): 300 mg via ORAL
  Filled 2014-09-30 (×4): qty 1

## 2014-09-30 MED ORDER — BACITRACIN-NEOMYCIN-POLYMYXIN 400-5-5000 EX OINT
TOPICAL_OINTMENT | Freq: Three times a day (TID) | CUTANEOUS | Status: DC
  Administered 2014-09-30 – 2014-10-03 (×8): 1 via TOPICAL
  Filled 2014-09-30 (×8): qty 1

## 2014-09-30 MED ORDER — ONDANSETRON 4 MG PO TBDP: 4.0000 mg | ORAL_TABLET | Freq: Three times a day (TID) | ORAL | Status: DC | PRN

## 2014-09-30 MED ORDER — PRAZOSIN HCL 1 MG PO CAPS
1.0000 mg | ORAL_CAPSULE | Freq: Every day | ORAL | Status: DC
  Administered 2014-09-30 – 2014-10-04 (×5): 1 mg via ORAL
  Filled 2014-09-30 (×6): qty 1
  Filled 2014-09-30: qty 14

## 2014-09-30 MED ORDER — METHOCARBAMOL 500 MG PO TABS
500.0000 mg | ORAL_TABLET | Freq: Four times a day (QID) | ORAL | Status: DC | PRN
  Administered 2014-09-30 – 2014-10-04 (×8): 500 mg via ORAL
  Filled 2014-09-30 (×8): qty 1

## 2014-09-30 MED ORDER — NICOTINE 21 MG/24HR TD PT24
21.0000 mg | MEDICATED_PATCH | Freq: Every day | TRANSDERMAL | Status: DC
  Administered 2014-09-30: 21 mg via TRANSDERMAL
  Filled 2014-09-30 (×2): qty 1

## 2014-09-30 MED ORDER — LIDOCAINE 5 % EX PTCH
1.0000 | MEDICATED_PATCH | Freq: Every day | CUTANEOUS | Status: DC
  Administered 2014-09-30 – 2014-10-05 (×6): 1 via TRANSDERMAL
  Filled 2014-09-30 (×3): qty 1
  Filled 2014-09-30: qty 14
  Filled 2014-09-30 (×5): qty 1

## 2014-09-30 MED ORDER — KETOROLAC TROMETHAMINE 30 MG/ML IJ SOLN
30.0000 mg | Freq: Once | INTRAMUSCULAR | Status: AC
Start: 2014-09-30 — End: 2014-09-30
  Administered 2014-09-30: 30 mg via INTRAMUSCULAR
  Filled 2014-09-30 (×2): qty 1

## 2014-09-30 MED ORDER — GABAPENTIN 300 MG PO CAPS
300.0000 mg | ORAL_CAPSULE | Freq: Three times a day (TID) | ORAL | Status: DC
  Administered 2014-09-30 – 2014-10-03 (×9): 300 mg via ORAL
  Filled 2014-09-30 (×14): qty 1

## 2014-09-30 MED ORDER — LIDOCAINE 5 % EX PTCH
1.0000 | MEDICATED_PATCH | CUTANEOUS | Status: DC
  Filled 2014-09-30 (×2): qty 1

## 2014-09-30 MED ORDER — TRAZODONE HCL 100 MG PO TABS
100.0000 mg | ORAL_TABLET | Freq: Every evening | ORAL | Status: DC | PRN
  Administered 2014-09-30: 100 mg via ORAL
  Filled 2014-09-30: qty 1

## 2014-09-30 NOTE — Progress Notes (Signed)
Writer has observed patient up in the dayroom interacting with select peers. She has been requesting prn medications frequently and when she returns to nursing station she will forget what she has taken previously and ask for it again. Writer encouraged patient to also use coping skills learned along with her medications. She c/o of pain but continues to sit up and talk with one particular female patient until the dayroom closes. Safety maintained on unit with 15 min checks.

## 2014-09-30 NOTE — Progress Notes (Signed)
Writer spoke with patient 1:1 and she reports that adjusting to the unit. She had visitors earlier and attended group tonight. She reports that her parents came and visited her and informed her that she will not be able to return to live with them at discharge. Patient became tearful talking about this reporting that she has no where to go. Writer encouraged her to speak with her case manager and to try to focus on herself. Safety maintained on uiit with 15 min checks.

## 2014-09-30 NOTE — Progress Notes (Signed)
Patient stated she does have SI thoughts at times, contracts for safety  Denied HI.  Denied A/V hallucinations.  Patient medicated for back pain and anxiety.  Respirations even and unlabored.  No signs/symptoms of pain distress noted on patient's face/body movements.  Safety maintained with 15 minute checks.
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
## 2014-09-30 NOTE — Progress Notes (Signed)
Patient ID: Dominique Bennett, female   DOB: [birth year], 29 y.o.   MRN: [id number] D: Patient reports depression and severe anxiety.  She rates her depression, hopelessness and anxiety as a 10.  She reports having nightmares and poor appetite.  She reports withdrawal symptoms as craving, agitation, runny nose, chilling and cramping and nausea.  She is having passive SI and contracts for safety on the unit.  She is up in the milieu interacting with her peers.  She presents and depressed, flat and anxious. A: Continue to monitor medication management and MD orders.  Safety checks completed every 15 minutes per protocol.  Meet 1:1 with patient to discuss concerns and offer encouragement. R: Patient's behavior is appropriate to situation.
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
## 2014-09-30 NOTE — [facility] Group Notes (Signed)
[facility] Group Notes:  (Clinical Social Work)  09/30/2014  1:15-2:15PM  Summary of Progress/Problems:   The main focus of today's process group was to   1)  discuss the importance of adding supports  2)  define health supports versus unhealthy supports  3)  identify the patient's current unhealthy supports and plan how to handle them  4)  Identify the patient's current healthy supports and plan what to add.  An emphasis was placed on using counselor, doctor, therapy groups, 12-step groups, and problem-specific support groups to expand supports.    The patient was monopolizing throughout group and resistant to suggestions.  It was suggested to her that she has more intensive needs and should meet with CSW 1:1 to decide on major life issues she is facing.    Type of Therapy:  Process Group with Motivational Interviewing  Participation Level:  Active  Participation Quality:  Monopolizing and Resistant  Affect:  Anxious, Depressed, Flat and Irritable  Cognitive:  Disorganized  Insight:  Lacking  Engagement in Therapy:  Limited  Modes of Intervention:   Education, Support and Processing, Activity  Ambrose MantleMareida Grossman-Orr, LCSW 09/30/2014

## 2014-09-30 NOTE — [facility] Group Notes (Signed)
[facility] Group Notes:  (Nursing/MHT/Case Management/Adjunct)  Date:  09/30/2014  Time:  1000 am  Type of Therapy:  Psychoeducational Skills  Participation Level:  Minimal  Participation Quality:  Attentive  Affect:  Flat  Cognitive:  Alert  Insight:  Lacking  Engagement in Group:  Lacking  Modes of Intervention:  Support  Summary of Progress/Problems: Patient came into group late.  States, "she feels like shit."  Rates her depression, anxiety and hopelessness as a 10.  Cranford MonBeaudry, Taiya Nutting Evans 09/30/2014, 10:52 AM

## 2014-10-01 DIAGNOSIS — F1329 Sedative, hypnotic or anxiolytic dependence with unspecified sedative, hypnotic or anxiolytic-induced disorder: Secondary | ICD-10-CM | POA: Diagnosis present

## 2014-10-01 DIAGNOSIS — F1120 Opioid dependence, uncomplicated: Secondary | ICD-10-CM | POA: Diagnosis present

## 2014-10-01 DIAGNOSIS — F4310 Post-traumatic stress disorder, unspecified: Secondary | ICD-10-CM

## 2014-10-01 DIAGNOSIS — F319 Bipolar disorder, unspecified: Secondary | ICD-10-CM | POA: Diagnosis present

## 2014-10-01 DIAGNOSIS — F3160 Bipolar disorder, current episode mixed, unspecified: Secondary | ICD-10-CM

## 2014-10-01 MED ORDER — CLONIDINE HCL 0.1 MG PO TABS
0.1000 mg | ORAL_TABLET | Freq: Four times a day (QID) | ORAL | Status: AC
  Administered 2014-10-01 – 2014-10-03 (×7): 0.1 mg via ORAL
  Filled 2014-10-01 (×12): qty 1

## 2014-10-01 MED ORDER — NAPROXEN 500 MG PO TABS
500.0000 mg | ORAL_TABLET | Freq: Two times a day (BID) | ORAL | Status: DC | PRN
  Administered 2014-10-02 (×2): 500 mg via ORAL
  Filled 2014-10-01 (×2): qty 1

## 2014-10-01 MED ORDER — DIVALPROEX SODIUM 500 MG PO DR TAB
500.0000 mg | DELAYED_RELEASE_TABLET | Freq: Two times a day (BID) | ORAL | Status: DC
  Administered 2014-10-01 – 2014-10-02 (×2): 500 mg via ORAL
  Filled 2014-10-01 (×7): qty 1

## 2014-10-01 MED ORDER — RISPERIDONE 1 MG PO TABS
1.0000 mg | ORAL_TABLET | Freq: Two times a day (BID) | ORAL | Status: DC
  Administered 2014-10-01 – 2014-10-02 (×2): 1 mg via ORAL
  Filled 2014-10-01 (×6): qty 1

## 2014-10-01 MED ORDER — NICOTINE POLACRILEX 2 MG MT GUM
2.0000 mg | CHEWING_GUM | OROMUCOSAL | Status: DC | PRN
  Administered 2014-10-01 – 2014-10-05 (×10): 2 mg via ORAL
  Filled 2014-10-01 (×2): qty 1

## 2014-10-01 MED ORDER — TRAZODONE HCL 50 MG PO TABS
50.0000 mg | ORAL_TABLET | Freq: Every evening | ORAL | Status: DC | PRN
  Administered 2014-10-01 – 2014-10-04 (×5): 50 mg via ORAL
  Filled 2014-10-01 (×4): qty 1
  Filled 2014-10-01: qty 28
  Filled 2014-10-01 (×8): qty 1
  Filled 2014-10-01: qty 28

## 2014-10-01 MED ORDER — TRAZODONE HCL 150 MG PO TABS
150.0000 mg | ORAL_TABLET | Freq: Every day | ORAL | Status: DC
  Filled 2014-10-01 (×2): qty 1

## 2014-10-01 MED ORDER — DICYCLOMINE HCL 20 MG PO TABS
20.0000 mg | ORAL_TABLET | Freq: Four times a day (QID) | ORAL | Status: DC | PRN
  Administered 2014-10-03: 20 mg via ORAL
  Filled 2014-10-01: qty 1

## 2014-10-01 MED ORDER — CLONIDINE HCL 0.1 MG PO TABS
0.1000 mg | ORAL_TABLET | Freq: Every day | ORAL | Status: DC
  Filled 2014-10-01: qty 1

## 2014-10-01 MED ORDER — CLONIDINE HCL 0.1 MG PO TABS
0.1000 mg | ORAL_TABLET | ORAL | Status: DC
  Administered 2014-10-04 – 2014-10-05 (×3): 0.1 mg via ORAL
  Filled 2014-10-01 (×4): qty 1

## 2014-10-01 MED ORDER — BENZTROPINE MESYLATE 0.5 MG PO TABS
0.5000 mg | ORAL_TABLET | Freq: Two times a day (BID) | ORAL | Status: DC
  Administered 2014-10-01 – 2014-10-02 (×2): 0.5 mg via ORAL
  Filled 2014-10-01 (×6): qty 1
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
## 2014-10-01 NOTE — Progress Notes (Signed)
D: Pt denies SI/HI/AVH. Pt is pleasant and cooperative. Pt was very lethargic upon first assessment in the evening. Pt could not stay awake long enough to give an answer longer than on word and pt could not stay awake longer than 15 sec at a time. Pt finally got up and upon talking to pt she was slurring her words and gait was unsteady. PA on duty assessed pt and medications given this evening were per his request.    A: Pt was offered support and encouragement. Pt was given scheduled medications. Pt was encourage to attend groups. Q 15 minute checks were done for safety.   R:Pt  Too lethargic to attend group and interacts well with peers and staff when awake. Pt is taking medication. Pt has no complaints .Pt receptive to treatment and safety maintained on unit.

## 2014-10-01 NOTE — Plan of Care (Signed)
Problem: Ineffective individual coping Goal: STG: Patient will remain free from self harm Outcome: Progressing Patient has remained free from self harm as evidenced by 15 minute checks.

## 2014-10-01 NOTE — Progress Notes (Addendum)
D:  Patient's self inventory sheet, patient has fair sleep, has nightmares, sleep medication is helpful.  Poor appetite, low energy level, poor concentration.  Rated depression, hopeless and anxiety #10.  Denied withdrawals.  SI, contracts for safety.  Physical problems, lightheaded, pain, headaches, back.  Worst pain #9 in past 24 hours, back, neck, legs.  Pain medication is not helpful.  Goal is to feel better and find a place to live.  Talk to MD and SW.  No discharge plans.  Does not have insurance and does not have a place to live. A:  Medications administered per MD orders.  Emotional support and encouragement given patient. R:  SI, contracts for safety, no plan.    Denied HI.  Denied A/V hallucinations.  Safety maintained with 15 minute checks. Patient continues to ask for medications, scheduled and prns.  Continues to say medications are not helping her.  Nurse has encouraged patient to try coping skills, go to groups and try to work through her problems.  Encouraged patient to believe in herself, that she can be a productive human being and has a lot of positives to contribute to society.    Patient has decided to move forward with her life and not to live in her depression.    Pages of information given to patient for her diagnoses and medications per patient's request.

## 2014-10-01 NOTE — Plan of Care (Signed)
Problem: Consults Goal: Depression Patient Education See Patient Education Module for education specifics.  Outcome: Completed/Met Date Met:  10/01/14 Nurse discussed depression/coping skills with patient.     

## 2014-10-01 NOTE — [facility] Group Notes (Signed)
   [facility]  10/01/2014  8:45 AM   Participation Quality: Alert, Appropriate and Oriented  Mood/Affect: Depressed and Flat  Depression Rating: 10  Anxiety Rating: 10  Thoughts of Suicide: Pt denies SI/HI  Will you contract for safety? Yes  Current AVH: Pt denies  Plan for Discharge/Comments: Pt attended discharge planning group and actively participated in group. CSW provided pt with today's workbook. Patient reports not feeling well today. She continues to endorse high depressive and anxiety related symptoms. She is unsure of her living arrangements at discharge and is requesting group home placement but has no income or insurance. She is in the process of applying for disability and has an upcoming disability physician exam on June 13th. She has no current outpatient provider.   Transportation Means: CSW continuing to assess  Supports: No supports mentioned at this time  Samuella BruinKristin Leeanna Slaby, MSW, [employer]LCSWA Clinical Social Worker [facility] [phone]

## 2014-10-01 NOTE — Progress Notes (Signed)
Pt attended wrap-up group and stated that her goal was to find placement for when she is d/c'd because she has nowhere to go.  Pt states that she wants to have a family meeting to help them understand what she is going through, Clinical research associatewriter encouraged her to discuss both placement and family meeting with her social worker and case manager on Monday.  In addition pt states that she continued to be stressed, anxious and her mind raced all day.   Dominique Bennett, MHT

## 2014-10-01 NOTE — Progress Notes (Signed)
EKG COMPLETED.  Patient drowsy, went to sleep during EKG.  Patient had stated "I don't have nobody to talk to."

## 2014-10-02 LAB — LIPID PANEL
CHOL/HDL RATIO: 3.4 ratio
Cholesterol: 142 mg/dL (ref 0–200)
HDL: 42 mg/dL (ref >40)
LDL Cholesterol: 72 mg/dL (ref 0–99)
TRIGLYCERIDES: 139 mg/dL (ref <150)
VLDL: 28 mg/dL (ref 0–40)

## 2014-10-02 LAB — TSH: TSH: 2.707 u[IU]/mL (ref 0.350–4.500)

## 2014-10-02 LAB — VALPROIC ACID LEVEL: Valproic Acid Lvl: 26 ug/mL — ABNORMAL LOW (ref 50.0–100.0)

## 2014-10-02 MED ORDER — DIVALPROEX SODIUM ER 500 MG PO TB24
500.0000 mg | ORAL_TABLET | Freq: Every morning | ORAL | Status: DC
  Administered 2014-10-03 – 2014-10-05 (×3): 500 mg via ORAL
  Filled 2014-10-02: qty 1
  Filled 2014-10-02: qty 14
  Filled 2014-10-02 (×6): qty 1

## 2014-10-02 MED ORDER — DIVALPROEX SODIUM ER 250 MG PO TB24
750.0000 mg | ORAL_TABLET | Freq: Every day | ORAL | Status: DC
  Administered 2014-10-02 – 2014-10-04 (×3): 750 mg via ORAL
  Filled 2014-10-02 (×4): qty 3
  Filled 2014-10-02: qty 42
  Filled 2014-10-02 (×2): qty 3

## 2014-10-02 MED ORDER — RISPERIDONE 1 MG PO TABS
1.0000 mg | ORAL_TABLET | Freq: Every day | ORAL | Status: DC
  Filled 2014-10-02: qty 1

## 2014-10-02 NOTE — [facility] Group Notes (Signed)
[facility] LCSW Group Therapy      Feelings About Diagnosis 1:15 - 2:30 PM         10/02/2014 3:38 PM    Type of Therapy:  Group Therapy  Participation Level:  Active  Participation Quality:  Appropriate  Affect: Depressed, Flat  Cognitive:  Alert and Appropriate  Insight:  Developing/Improving   Engagement in Therapy:  Developing/Improving and Engaged  Modes of Intervention:  Discussion, Education, Exploration, Problem-Solving, Rapport Building, Support  Summary of Progress/Problems:  Patient actively participated in group. Patient discussed past and present diagnosis and the effects it has had on  life.  Patient shared she is embarrassed and feels family look at her differently.  Wynn BankerHodnett, Tahiry Spicer Hairston 10/02/2014  3:38 PM

## 2014-10-02 NOTE — Progress Notes (Addendum)
Patient stated she has kidney stones, holding R abdomen.  VS 108/50 P 83 sitting, 112/70 P95 standing.  NP's informed who talked with patient.  Patient told NP's that she feels she should go to ER and she will be given Dilaudid for pain.  UA was completed and put in lab for pick up this afternoon.  Respirations even and unlabored.  Patient has no signs/symptoms of acute distress observed by nursing staff and NP's.  Will continue to monitor patient by nursing staff.  1900  Patient and staff has talked to 2 MD's.  NP's informed of patient's behavior.  Patient went to dining room for dinner.  Has been sitting in dayroom talking with peers and staff.  Gatorade given to patient after dinner.  Respirations even and unlabored.  No signs/symptoms of pain/distress noted on patient's face/body movements.  Safety maintained with 15 minute checks.

## 2014-10-02 NOTE — Progress Notes (Signed)
Recreation Therapy Notes  Animal-Assisted Activity (AAA) Program Checklist/Progress Notes Patient Eligibility Criteria Checklist & Daily Group note for Rec Tx Intervention  Date: 05.31.16 Time: 2:30pm [address]   AAA/T Program Assumption of Risk Form signed by Patient/ or Parent Legal Guardian yes  Patient is free of allergies or sever asthma yes  Patient reports no fear of animals yes  Patient reports no history of cruelty to animalsyes  Patient understands his/her participation is voluntary yes  Patient washes hands before animal contact yes  Patient washes hands after animal contact yes  Behavioral Response: Engaged  Education: Charity fundraiserHand Washing, Appropriate Animal Interaction   Education Outcome: Acknowledges understanding/In group clarification offered/Needs additional education.   Clinical Observations/Feedback: Patient pet the dog and asked questions.   Caroll RancherMarjette Nakiah Osgood, LRT/CTRS         Caroll RancherLindsay, Terry Bolotin A 10/02/2014 4:36 PM

## 2014-10-02 NOTE — Progress Notes (Addendum)
Discussed medications and VS with MD who stated to hold medications until he talks with patient.  Patient sitting in dayroom with peers, talking, laughing.  VS manually taken this morning.  Patient stated she feels lightheaded.  While standing for VS, patient could not stand up straight, wavering back and forth.  Respirations even and unlabored.  No signs/symptoms of pain/distress noted on patient's face/body movements.  Safety maintained with 15 minute checks.  1115  Patient has attended morning groups.  Has been seen laughing, talking to peers.   No signs/symptoms of pain/distress noted on patient's face/body movements.  Respirations even and unlabored.  Safety maintained with 15 minute checks. D:  Patient's self inventory sheet, patient has fair sleep, sleep medication not given.  Fair appetite, low energy level, poor concentration.  Rated depression, hopeless and anxiety 10.  Withdrawals of chilling, cravings, nausea.  SI, contracts for safety, no plan.  Physical pain, worst pain #9, neck, back, legs and hips, pain medication not helpful.  "IDK" about goal.  "I'm very depressed.  "Called dad and has a ___ hole."  No discharge plans.  "Don't know where I'm going to live,  get medicine and anything else." A:  Medications to be given after MD talks to patient.  Emotional support and encouragement given patient. R:  Denied HI.  Denied A/V hallucinations.  SI, no plan, contracts for safety.  Safety maintained with 15 minute checks.  1445  UA obtained and put in lab for pick up.

## 2014-10-02 NOTE — [facility] Suicide Risk Assessment (Signed)
[facility] INPATIENT:  Family/Significant Other Suicide Prevention Education  Suicide Prevention Education:  Education Completed; Daymon Larsenimothy Dills, Father, [phone]; has been identified by the patient as the family member/significant other with whom the patient will be residing, and identified as the person(s) who will aid the patient in the event of a mental health crisis (suicidal ideations/suicide attempt).  With written consent from the patient, the family member/significant other has been provided the following suicide prevention education, prior to the and/or following the discharge of the patient.  The suicide prevention education provided includes the following:  Suicide risk factors  Suicide prevention and interventions  National Suicide Hotline telephone number  [facility] assessment telephone number  [facility] Emergency Assistance 911  [facility] and/or Residential Mobile Crisis Unit telephone number  Request made of family/significant other to:  Remove weapons (e.g., guns, rifles, knives), all items previously/currently identified as safety concern.   Father advised patient does not have access to weapons.    Remove drugs/medications (over-the-counter, prescriptions, illicit drugs), all items previously/currently identified as a safety concern.  The family member/significant other verbalizes understanding of the suicide prevention education information provided.  The family member/significant other agrees to remove the items of safety concern listed above.  Wynn BankerHodnett, Nashly Olsson Hairston 10/02/2014  3:39 PM
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
## 2014-10-02 NOTE — Tx Team (Signed)
Interdisciplinary Treatment Plan Update (Adult)  Date:  10/02/2014  Time Reviewed:  12:35 PM   Progress in Treatment: Attending groups: Patient is attending groups. Participating in groups:  Patient engages in discussion Taking medication as prescribed:  Patient is taking medications Tolerating medication:  Patient is tolerating medications Family/Significant othe contact made:   No, but will asked for consent to make collateral contact Patient understands diagnosis:Yes, patient understands diagnosis and need for treatment Discussing patient identified problems/goals with staff:  Yes, patient is able to express goals/problems Medical problems stabilized or resolved:  Yes Denies suicidal/homicidal ideation:No.  Patient endorses SI but contact for safety. Issues/concerns per patient self-inventory:   Other:  Discharge Plan or Barriers:  Patient is requesting residential treatment  Reason for Continuation of Hospitalization: Anxiety Depression Medication stabilization   Comments:  Additional comments:  Patient and CSW reviewed Patient Discharge Process Letter/Patient Involvement Form.  Patient verbalized understanding and signed form.  Patient and CSW also reviewed and identified patient's goals and treatment plan.  Patient verbalized understanding and agreed to plan.  Estimated length of stay:  3-4 days  New goal(s):  Review of initial/current patient goals per problem list:  Please see plan of careInterdisciplinary Treatment Plan Update (Adult)  Attendees: Patient 10/02/2014 12:35 PM   Family:   10/02/2014 12:35 PM   Physician:  Nehemiah MassedFernando Cobos, MD 10/02/2014 12:35 PM   Nursing:    Quintella ReichertBeverly Knight, RN 10/02/2014 12:35 PM   Clinical Social Worker:  Juline PatchQuylle Samarion Ehle, LCSW 10/02/2014 12:35 PM   Clinical Social Worker:  Belenda CruiseKristin Drinkard, LCSW-A 10/02/2014 12:35 PM   Case Manager:  Onnie BoerJennifer Clark, RN 10/02/2014 12:35 PM   Other:  Vivi FernsAshley Strader, RN 10/02/2014 12:35 PM  Other:   10/02/2014   12:35 PM   Other:  10/02/2014 12:35 PM   Other:  10/02/2014 12:35 PM   Other:  10/02/2014 12:35 PM   Other:  Chad CordialValerie Enoch, [facility] Transition Team Coordinator 10/02/2014 12:35 PM   Other:   10/02/2014 12:35 PM   Other:  10/02/2014 12:35 PM   Other:   10/02/2014 12:35 PM    Scribe for Treatment Team:   Wynn BankerHodnett, Darnella Zeiter Hairston, 10/02/2014   12:35 PM

## 2014-10-02 NOTE — [facility] Group Notes (Signed)
The focus of this group is to educate the patient on the purpose and policies of crisis stabilization and provide a format to answer questions about their admission.  The group details unit policies and expectations of patients while admitted.  Patient attended 0900 nurse education orientation group this morning.  Patient actively participated, appropriate affect, alert, appropriate insight and engagement.  Today patient will work on 3 goals for discharge.  

## 2014-10-02 NOTE — Progress Notes (Signed)
Pt attended spiritual care group on grief and loss facilitated by chaplain Archibald Marchetta. Group opened with brief discussion and psycho-social ed around grief and loss in relationships and in relation to self - identifying life patterns, circumstances, changes that cause losses. Established group norm of speaking from own life experience. Group goal of establishing open and affirming space for members to share loss and experience with grief, normalize grief experience and provide psycho social education and grief support.  Group drew on narrative and Alderian therapeutic modalities.      

## 2014-10-03 DIAGNOSIS — F319 Bipolar disorder, unspecified: Secondary | ICD-10-CM | POA: Insufficient documentation

## 2014-10-03 DIAGNOSIS — R319 Hematuria, unspecified: Secondary | ICD-10-CM | POA: Insufficient documentation

## 2014-10-03 LAB — URINALYSIS, ROUTINE W REFLEX MICROSCOPIC
BILIRUBIN URINE: NEGATIVE
Glucose, UA: NEGATIVE mg/dL
KETONES UR: NEGATIVE mg/dL
Nitrite: NEGATIVE
PH: 6.5 (ref 5.0–8.0)
PROTEIN: NEGATIVE mg/dL
SPECIFIC GRAVITY, URINE: 1.014 (ref 1.005–1.030)
Urobilinogen, UA: 0.2 mg/dL (ref 0.0–1.0)

## 2014-10-03 LAB — URINE MICROSCOPIC-ADD ON

## 2014-10-03 LAB — CBC
HCT: 32.1 % — ABNORMAL LOW (ref 36.0–46.0)
Hemoglobin: 10.7 g/dL — ABNORMAL LOW (ref 12.0–15.0)
MCH: 33.1 pg (ref 26.0–34.0)
MCHC: 33.3 g/dL (ref 30.0–36.0)
MCV: 99.4 fL (ref 78.0–100.0)
Platelets: 257 K/uL (ref 150–400)
RBC: 3.23 MIL/uL — ABNORMAL LOW (ref 3.87–5.11)
RDW: 12.3 % (ref 11.5–15.5)
WBC: 8.1 K/uL (ref 4.0–10.5)

## 2014-10-03 LAB — URINALYSIS W MICROSCOPIC (NOT AT ARMC)
BILIRUBIN URINE: NEGATIVE
Glucose, UA: NEGATIVE mg/dL
KETONES UR: NEGATIVE mg/dL
Nitrite: NEGATIVE
PH: 6.5 (ref 5.0–8.0)
Protein, ur: NEGATIVE mg/dL
Specific Gravity, Urine: 1.008 (ref 1.005–1.030)
Urobilinogen, UA: 0.2 mg/dL (ref 0.0–1.0)

## 2014-10-03 LAB — BASIC METABOLIC PANEL
ANION GAP: 8 (ref 5–15)
BUN: 14 mg/dL (ref 6–20)
CO2: 28 mmol/L (ref 22–32)
Calcium: 9.7 mg/dL (ref 8.9–10.3)
Chloride: 103 mmol/L (ref 101–111)
Creatinine, Ser: 0.81 mg/dL (ref 0.44–1.00)
GFR calc non Af Amer: >60 mL/min (ref >60)
Glucose, Bld: 113 mg/dL — ABNORMAL HIGH (ref 65–99)
Potassium: 3.9 mmol/L (ref 3.5–5.1)
Sodium: 139 mmol/L (ref 135–145)

## 2014-10-03 LAB — HEMOGLOBIN A1C
Hgb A1c MFr Bld: 5.4 % (ref 4.8–5.6)
Mean Plasma Glucose: 108 mg/dL

## 2014-10-03 LAB — HCG, SERUM, QUALITATIVE: PREG SERUM: NEGATIVE

## 2014-10-03 MED ORDER — ARIPIPRAZOLE 2 MG PO TABS
2.0000 mg | ORAL_TABLET | Freq: Once | ORAL | Status: AC
  Administered 2014-10-03: 2 mg via ORAL
  Filled 2014-10-03 (×2): qty 1

## 2014-10-03 MED ORDER — GABAPENTIN 300 MG PO CAPS
600.0000 mg | ORAL_CAPSULE | Freq: Three times a day (TID) | ORAL | Status: DC
  Administered 2014-10-03 – 2014-10-05 (×5): 600 mg via ORAL
  Filled 2014-10-03 (×2): qty 2
  Filled 2014-10-03: qty 84
  Filled 2014-10-03: qty 2
  Filled 2014-10-03: qty 84
  Filled 2014-10-03 (×3): qty 2
  Filled 2014-10-03: qty 84
  Filled 2014-10-03 (×2): qty 2

## 2014-10-03 NOTE — Progress Notes (Signed)
Called by psychiatry, Dr Margie Billetobbs for [name], 29 year old with history of bipolar disorder, PTSD, asthma, depression, chronic back pain. Request for assistance for microscopic hematuria. Patient currently has no abdominal pain. No gross hematuria.  - Recommended to stop NSAIDs - will check UA, culture to rule out UTI.  - check BMET to rule out any renal insufficiency, CBC to rule out anemia - If all workup above is negative, may check renal ultrasound. - Reviewed the patient's prior CT renal study in 10/15, no prior obstructing renal or ureteral calculi, tiny nonobstructing bilateral renal stones. Can be repeated if patient has any abdominal pain or renal insufficiency. - Patient will benefit from outpatient urology workup for hematuria, may need cystoscopy  - d/w Dr Erick Blinksobbs    Dominique Bennett M.D. [facility] 10/03/2014, 1:17 PM  Pager: [phone]

## 2014-10-03 NOTE — Progress Notes (Signed)
Patient ID: Dominique Bennett, female   DOB: [birth year], 30 y.o.   MRN: [id number] D: Patient denies SI/HI and auditory and visual hallucinations. Patient has a depressed mood and affect. Patient reports sleeping well last PM. Rates depression 10 on 1 to 10 scale. Rates hopelessness 10 on 1 to 10 scale. Rates anxiety as 10 on 1 to 10 scale. Rates pain as 9 on her side, back, neck, legs. Goal for today to feel good. States she is tired of being in physical pain.  A: Patient given emotional support from RN. Patient given medications per MD orders. Patient encouraged to attend groups and unit activities. Patient encouraged to come to staff with any questions or concerns.  R: Patient remains cooperative and appropriate. Multiple requests for medications.Interacting with peers appropriately. Will continue to monitor patient for safety.

## 2014-10-03 NOTE — Progress Notes (Signed)
D: Pt denies SI/HI/VH.  +ve AH- not command. Pt is pleasant and cooperative. Pt complained that she was having Kidney stone problems. Pt did not appear to be in any immediate distress on the unit. Pt was observed joking with other patients. Pt was encouraged to drink plenty of water.   A: Pt was offered support and encouragement. Pt was given scheduled medications. Pt was encourage to attend groups. Q 15 minute checks were done for safety.    R:Pt attends groups and interacts well with peers and staff. Pt is taking medication .Pt receptive to treatment and safety maintained on unit. Pt wanted to go to ED to get pain medication.

## 2014-10-03 NOTE — Progress Notes (Addendum)
Patient on the hallway at the beginning of this shift. She reported that she had a rough day. Endorsed passive SI, but contracted for safety. She reported that her mother doesn't want her to come home; " I don't know why she would not let my brothers stay home but not me". She appeared  Sad and depressed and have very poor insight. Her speech slurred and mood and affects sad and depressed. Writer encouraged and supported patient. Q 15 minute check continues as ordered to maintain safety.   2200:  Patient speech remains slurred and she appeared very drugy and drowsy; although she continues to ask for medications. Writer encouraged patient to lay down and rest.

## 2014-10-03 NOTE — [facility] Group Notes (Signed)
.  [facility]   10/03/2014 1:03 PM    Participation Quality:  Appropraite  Mood/Affect:  Appropriate  Depression Rating:  9  Anxiety Rating:  10  Thoughts of Suicide:  No  Will you contract for safety?   NA  Current AVH:  No  Plan for Discharge/Comments:  Patient attended discharge planning group and actively participated in group. Patient is interested in residential treatment.  A referral will be made to [facility] and [facility].  Suicide prevention education reviewed and SPE document provided.   Transportation Means: Patient has transportation.   Supports:  Patient has a support system.   Tennile Styles, Joesph JulyQuylle Hairston
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
## 2014-10-03 NOTE — Progress Notes (Signed)
Patient ID: Dominique Bennett, female   DOB: [birth year], 29 y.o.   MRN: [id number] [facility] MD Progress Note  10/03/2014 12:24 PM Riely Oetken  MRN:  [id number] Subjective:  She reports that she is a " little bit better today". She describes ongoing abdominal pain, but better than yesterday. She denies medication side effects. She slept better. Still reports significant anxiety, and remains somatically focused.    Objective: Patient seen and chart reviewed.Discussed patient with treatment team.  On the unit patient  Has been visible in milieu, noted to be interacting appropriately with peers. She has reported history of kidney stones, and yesterday had reported increased flank/abdominal pain, now improved . She does have a  Prior history of hematuria.Today less focused on pain, and not appearing to be in any acute distress or discomfort. No significant restlessness or agitation. She does continue to be focused on medications- in essence, she states she has chronic pain and chronic anxiety, and due to being unable to get medications for these conditions because of lack of insurance, she had been buying opiates and BZDs on the street. Emphasizes she took these to address symptoms and " not to get high".  She realizes these medications are addictive, but is unsure if she became addicted to them.  She is interested in sobriety, and has expressed desire to look into Rehabs as a disposition option, but worries and ruminates how her pain will be managed off opiates . She continues to report significant anxiety, and worries about her housing situation after she leaves, as was living with BF who reportedly does not want to continue relationship. She states father has told her she cannot go live with him at this time.  Thus far tolerating medications well, and denies side effects- she feels neurontin has helped pain modestly.  Reports significant short term mood swings, several during a day or even minutes- hour. No  clear history of protracted/discreet manic or hypomanic episodes. Serum pregnancy  Test negative   Principal Problem: Bipolar disorder ,  R/o substance induced ( opioid,bzd) bipolar disorder verus Bipolar I . Diagnosis:   Patient Active Problem List   Diagnosis Date Noted  . PTSD (post-traumatic stress disorder) [F43.10] 10/01/2014  . Opioid use disorder, moderate, dependence [F11.20] 10/01/2014  . 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 Bipolar disorder [F31.9] 10/01/2014   Total Time spent with patient: 25 minutes    Past Medical History:  Past Medical History  Diagnosis Date  . Migraines   . Asthma   . Chronic back pain   . Depression   . Anxiety   . Bipolar 1 disorder   . Sleep disorder     Past Surgical History  Procedure Laterality Date  . Tonsillectomy    . Cesarean section    . Lithotripsy     Family History: History reviewed. No pertinent family history. Social History:  History  Alcohol Use No     History  Drug Use  . Yes    History   Social History  . Marital Status: Married    Spouse Name: N/A  . Number of Children: N/A  . Years of Education: N/A   Social History Main Topics  . Smoking status: Current Every Day Smoker -- 2.00 packs/day    Types: Cigarettes  . Smokeless tobacco: Not on file  . Alcohol Use: No  . Drug Use: Yes  . Sexual Activity: Not on file   Other Topics Concern  . None  Social History Narrative   Additional History:    Sleep: fair   Appetite:  improving     Musculoskeletal: Strength & Muscle Tone: within normal limits Gait & Station: normal Patient leans: N/A   Psychiatric Specialty Exam: Physical Exam  ROS- reports pelvic pain- but better today, reports hematuria. No chills, no fever, no dysuria. No flank pain.  Blood pressure 126/66, pulse 83, temperature 97.7 F (36.5 C), temperature source Oral, resp. rate 16, height 5' 4.17" (1.63 m), weight 155 lb (70.308  kg), last menstrual period 09/03/2014, SpO2 100 %.Body mass index is 26.46 kg/(m^2).  General Appearance: improved grooming   Eye Contact::  Good  Speech:  Normal Rate  Volume:  Normal  Mood: still depressed but affect more reactive, smiles  Briefly at times, still tearful at times   Affect:  Labile  Thought Process:  Linear   Orientation:  Full (Time, Place, and Person)  Thought Content:  Rumination about relationship issues, denies hallucinations and does not appear internally preoccupied, no delusions  Suicidal Thoughts:  No- reports occasional passive thoughts of being better off dead, feeling " like I have become a burden to people", but states " I want to live, I want to get better", and denies SI.   Homicidal Thoughts:  No  Memory:  Immediate;   Fair Recent;   Fair Remote;   Fair  Judgement:  Fair  Insight:  Lacking  Psychomotor Activity:  Normal, no current tremors or agitation /restlessness noted   Concentration:  Fair  Recall:  [employer]Fair  Fund of Knowledge:Fair  Language: Fair  Akathisia:  No  Handed:  Right  AIMS (if indicated):     Assets:  Desire for Improvement Physical Health  ADL's: fair   Cognition: WNL  Sleep:  Number of Hours: 5.5     Current Medications: Current Facility-Administered Medications  Medication Dose Route Frequency Provider Last Rate Last Dose  . acetaminophen (TYLENOL) tablet 650 mg  650 mg Oral Q6H PRN Shuvon B Rankin, NP      . alum & mag hydroxide-simeth (MAALOX/MYLANTA) 200-200-20 MG/5ML suspension 30 mL  30 mL Oral Q4H PRN Shuvon B Rankin, NP      . ARIPiprazole (ABILIFY) tablet 2 mg  2 mg Oral Once Rockey SituFernando A Gloriajean Okun, MD      . cloNIDine (CATAPRES) tablet 0.1 mg  0.1 mg Oral QID Jomarie LongsSaramma Eappen, MD   0.1 mg at 10/03/14 1154   Followed by  . [START ON 10/04/2014] cloNIDine (CATAPRES) tablet 0.1 mg  0.1 mg Oral BH-qamhs Jomarie LongsSaramma Eappen, MD       Followed by  . [START ON 10/06/2014] cloNIDine (CATAPRES) tablet 0.1 mg  0.1 mg Oral QAC breakfast  Saramma Eappen, MD      . dicyclomine (BENTYL) tablet 20 mg  20 mg Oral Q6H PRN Jomarie LongsSaramma Eappen, MD   20 mg at 10/03/14 0817  . divalproex (DEPAKOTE ER) 24 hr tablet 500 mg  500 mg Oral q morning - 10a Rockey SituFernando A Ha Placeres, MD   500 mg at 10/03/14 1010  . divalproex (DEPAKOTE ER) 24 hr tablet 750 mg  750 mg Oral QHS Craige CottaFernando A Johnnay Pleitez, MD   750 mg at 10/02/14 2131  . gabapentin (NEURONTIN) capsule 300 mg  300 mg Oral TID Rachael FeeIrving A Lugo, MD   300 mg at 10/03/14 1154  . gabapentin (NEURONTIN) capsule 300 mg  300 mg Oral QHS Rachael FeeIrving A Lugo, MD   300 mg at 10/02/14 2131  . ketorolac (TORADOL) tablet  10 mg  10 mg Oral Q6H PRN Rachael Fee, MD   10 mg at 10/03/14 0816  . lidocaine (LIDODERM) 5 % 1 patch  1 patch Transdermal Daily Craige Cotta, MD   1 patch at 10/03/14 0813  . magnesium hydroxide (MILK OF MAGNESIA) suspension 30 mL  30 mL Oral Daily PRN Shuvon B Rankin, NP      . methocarbamol (ROBAXIN) tablet 500 mg  500 mg Oral Q6H PRN Rachael Fee, MD   500 mg at 10/03/14 0816  . naproxen (NAPROSYN) tablet 500 mg  500 mg Oral BID PRN Jomarie Longs, MD   500 mg at 10/02/14 1707  . neomycin-bacitracin-polymyxin (NEOSPORIN) ointment   Topical TID Adonis Brook, NP   1 application at 10/03/14 0810  . nicotine polacrilex (NICORETTE) gum 2 mg  2 mg Oral PRN Craige Cotta, MD   2 mg at 10/03/14 0820  . prazosin (MINIPRESS) capsule 1 mg  1 mg Oral QHS Rachael Fee, MD   1 mg at 10/02/14 2130  . thiamine (VITAMIN B-1) tablet 100 mg  100 mg Oral Daily Shuvon B Rankin, NP   100 mg at 10/03/14 0809  . traZODone (DESYREL) tablet 50 mg  50 mg Oral QHS,MR X 1 Kerry Hough, PA-C   50 mg at 10/02/14 2131    Lab Results:  Results for orders placed or performed during the hospital encounter of 09/29/14 (from the past 48 hour(s))  Valproic acid level     Status: Abnormal   Collection Time: 10/02/14  6:20 AM  Result Value Ref Range   Valproic Acid Lvl 26 (L) 50.0 - 100.0 ug/mL    Comment: Performed at [facility]  Hemoglobin A1c     Status: None   Collection Time: 10/02/14  6:20 AM  Result Value Ref Range   Hgb A1c MFr Bld 5.4 4.8 - 5.6 %    Comment: (NOTE)         Pre-diabetes: 5.7 - 6.4         Diabetes: >6.4         Glycemic control for adults with diabetes: <7.0    Mean Plasma Glucose 108 mg/dL    Comment: (NOTE) Performed At: [facility] [address] [place], [place] [id number] Mila Homer MD [id number] Performed at [facility]   Lipid panel     Status: None   Collection Time: 10/02/14  6:20 AM  Result Value Ref Range   Cholesterol 142 0 - 200 mg/dL   Triglycerides 784 <696 mg/dL   HDL 42 >29 mg/dL   Total CHOL/HDL Ratio 3.4 RATIO   VLDL 28 0 - 40 mg/dL   LDL Cholesterol 72 0 - 99 mg/dL    Comment:        Total Cholesterol/HDL:CHD Risk Coronary Heart Disease Risk Table                     Men   Women  1/2 Average Risk   3.4   3.3  Average Risk       5.0   4.4  2 X Average Risk   9.6   7.1  3 X Average Risk  23.4   11.0        Use the calculated Patient Ratio above and the CHD Risk Table to determine the patient's CHD Risk.        ATP III CLASSIFICATION (LDL):  <100  mg/dL   Optimal  119-147  mg/dL   Near or Above                    Optimal  130-159  mg/dL   Borderline  829-562  mg/dL   High  >130     mg/dL   Very High Performed at [facility]   TSH     Status: None   Collection Time: 10/02/14  6:20 AM  Result Value Ref Range   TSH 2.707 0.350 - 4.500 uIU/mL    Comment: Performed at [facility]  Urinalysis with microscopic     Status: Abnormal   Collection Time: 10/02/14  2:33 PM  Result Value Ref Range   Color, Urine YELLOW YELLOW   APPearance CLOUDY (A) CLEAR   Specific Gravity, Urine 1.008 1.005 - 1.030   pH 6.5 5.0 - 8.0   Glucose, UA NEGATIVE NEGATIVE mg/dL   Hgb urine dipstick MODERATE (A) NEGATIVE   Bilirubin Urine NEGATIVE NEGATIVE   Ketones, ur NEGATIVE NEGATIVE mg/dL    Protein, ur NEGATIVE NEGATIVE mg/dL   Urobilinogen, UA 0.2 0.0 - 1.0 mg/dL   Nitrite NEGATIVE NEGATIVE   Leukocytes, UA TRACE (A) NEGATIVE   WBC, UA 3-6 <3 WBC/hpf   RBC / HPF 3-6 <3 RBC/hpf   Bacteria, UA FEW (A) RARE   Squamous Epithelial / LPF FEW (A) RARE    Comment: Performed at [facility]  hCG, serum, qualitative     Status: None   Collection Time: 10/03/14  6:17 AM  Result Value Ref Range   Preg, Serum NEGATIVE NEGATIVE    Comment:        THE SENSITIVITY OF THIS METHODOLOGY IS >10 mIU/mL. Performed at [facility]     Physical Findings: AIMS: Facial and Oral Movements Muscles of Facial Expression: None, normal Lips and Perioral Area: None, normal Jaw: None, normal Tongue: None, normal,Extremity Movements Upper (arms, wrists, hands, fingers): None, normal Lower (legs, knees, ankles, toes): None, normal, Trunk Movements Neck, shoulders, hips: None, normal, Overall Severity Severity of abnormal movements (highest score from questions above): None, normal Incapacitation due to abnormal movements: None, normal Patient's awareness of abnormal movements (rate only patient's report): No Awareness, Dental Status Current problems with teeth and/or dentures?: No Does patient usually wear dentures?: No  CIWA:  CIWA-Ar Total: 2 COWS:  COWS Total Score: 5  Assessment Patient reports chronic pain, depression , anxiety. She has a history of abusing opiates and BZDs , and although recognizes pattern of abuse, feels she needed these medications to treat her pain/anxiety. Tolerating detox well, and is interested in going to a Rehab Setting if possible, but quite anxious about how pain will be managed off opiates. Yesterday was complaining of episode of urolithiasis and does have a history of several prior episodes and of chronic hematuria. Today seems better, in no acute distress or discomfort, and less focused on pain issues . We discussed  medication options- patient interested in Abilify, as augmentation strategy for depression. She tolerated Minipress well , and states did not have nightmares last night and slept better.    Treatment Plan Summary: Daily contact with patient to assess and evaluate symptoms and progress in treatment and Medication management  Has completed BZD detox.  Continue Clonidine protocol for opiate WDL. Hold if BP low .  Continue Depakote ER to  500 mg QAM and 750 mgrs QHS  for mood  Disorder, lability. We discussed  side effect profile , to include teratogenic side effects. Decrease Risperidone to 1 mgrs QHS - for Mood Lability, PTSD symptoms Continue Trazodone 50 mg  QHS for insomnia  Continue Prazosin 1 mg QHS  For PTSD associated  nightmares. Increase Neurontin to 600 mgrs TID   for anxiety  And chronic pain. Will continue pain management - lidocaine patch, toradol prn, naproxen prn. CSW will work on disposition. As noted, patient wanting to go to a Rehab. Continue to encourage early recovery and relapse prevention efforts . Will contact Hospitalist consult regarding hematuria.  Medical Decision Making:  Review of Psycho-Social Stressors (1), Review or order clinical lab tests (1), Review of Last Therapy Session (1), Review of Medication Regimen & Side Effects (2) and Review of New Medication or Change in Dosage (2)     Henya Aguallo MD 10/03/2014, 12:24 PM

## 2014-10-03 NOTE — Progress Notes (Signed)
Adult Psychoeducational Group Note  Date:  10/03/2014 Time:  11:16 PM  Group Topic/Focus:  Wrap-Up Group:   The focus of this group is to help patients review their daily goal of treatment and discuss progress on daily workbooks.  Participation Level:  Active  Participation Quality:  Appropriate and Drowsy  Affect:  Appropriate  Cognitive:  Appropriate and Lacking  Insight: Good and Limited  Engagement in Group:  Distracting and Limited  Modes of Intervention:  Discussion  Additional Comments:  Patients goal was to stay cool and calm and to not lose control, however she expressed she did not meet that goal and that she still wants to work on it. She also said that she is worried about having a place to go to once she leaves here, very concerned about living situation.  Natasha MeadKiara M Daleysa Kristiansen 10/03/2014, 11:16 PM

## 2014-10-03 NOTE — Progress Notes (Signed)
Recreation Therapy Notes  Date: 06.01.16 Time: 9:30am Location: [address] Dayroom  Group Topic: Stress Management  Goal Area(s) Addresses:  Patient will verbalize importance of using healthy stress management.  Patient will identify positive emotions associated with healthy stress management.    Behavioral Response:  Engaged  Intervention: [place] Guided Imagery Script  Activity : Guided Imagery Script. LRT introduced and educated patients on stress management technique of guided imagery.  A script was used to deliver the technique to patients.  Patients were asked to follow script read by LRT to engage in practicing stress management technique.   Education:  Stress Management, Discharge Planning.   Education Outcome: Acknowledges edcuation/In group clarification offered/Needs additional education  Clinical Observations/Feedback: Patient attended group and was engaged.  Anselmo Reihl, LRT/CTRS         Aldene Hendon A 10/03/2014 3:37 PM 

## 2014-10-03 NOTE — Clinical Social Work Note (Signed)
CSW spoke with patient's father who advised patient will not be able to discharge to his home.  He shared he is checking with family to see if they can take her in.  Patient advised he will assist patient with transportation if she is accepted to [facility].

## 2014-10-04 LAB — URINE CULTURE
COLONY COUNT: NO GROWTH
Culture: NO GROWTH
SPECIAL REQUESTS: NORMAL

## 2014-10-04 LAB — RETICULOCYTES
RBC.: 3.25 MIL/uL — ABNORMAL LOW (ref 3.87–5.11)
RETIC COUNT ABSOLUTE: 48.8 10*3/uL (ref 19.0–186.0)
RETIC CT PCT: 1.5 % (ref 0.4–3.1)

## 2014-10-04 LAB — GABAPENTIN LEVEL: Gabapentin Lvl: 5.1 ug/mL

## 2014-10-04 MED ORDER — ACYCLOVIR 5 % EX OINT
TOPICAL_OINTMENT | Freq: Every day | CUTANEOUS | Status: DC
  Administered 2014-10-04 – 2014-10-05 (×4): via TOPICAL
  Filled 2014-10-04 (×2): qty 30

## 2014-10-04 NOTE — Progress Notes (Signed)
[facility] Group Notes:  (Nursing/MHT/Case Management/Adjunct)  Date:  10/04/2014  Time:  10:26 PM  Type of Therapy:  Psychoeducational Skills  Participation Level:  Active  Participation Quality:  Appropriate  Affect:  Appropriate  Cognitive:  Appropriate  Insight:  Good  Engagement in Group:  Engaged  Modes of Intervention:  Education  Summary of Progress/Problems: The patient verbalized that she had a pretty good day overall and that she anticipates being discharged tomorrow. The patient kept herself busy today by playing cards with her peers. Her goal for tomorrow is to get some help in dealing with her addictions.   Hazle CocaGOODMAN, Faisal Stradling S 10/04/2014, 10:26 PM

## 2014-10-04 NOTE — Progress Notes (Signed)
Called by psychiatry, Dr Margie Billetobbs in review of UA and labs done   BMET    Component Value Date/Time   NA 139 10/03/2014 1933   K 3.9 10/03/2014 1933   CL 103 10/03/2014 1933   CO2 28 10/03/2014 1933   GLUCOSE 113* 10/03/2014 1933   BUN 14 10/03/2014 1933   CREATININE 0.81 10/03/2014 1933   CALCIUM 9.7 10/03/2014 1933   GFRNONAA >60 10/03/2014 1933   GFRAA >60 10/03/2014 1933   CBC    Component Value Date/Time   WBC 8.1 10/03/2014 1933   RBC 3.23* 10/03/2014 1933   HGB 10.7* 10/03/2014 1933   HCT 32.1* 10/03/2014 1933   PLT 257 10/03/2014 1933   MCV 99.4 10/03/2014 1933   MCH 33.1 10/03/2014 1933   MCHC 33.3 10/03/2014 1933   RDW 12.3 10/03/2014 1933    - UA dipstick reviewed - Called Dr. Vernie Ammonsttelin, urology on-call, who reviewed the UA and does not feel patient has hematuria it is likely lysed RBCs, patient has large HgB, RBCs only 3-6. , No UTI. - Recommended outpatient urological workup if patient has hematuria.  -I have ordered an anemia panel, fecal occult blood test to rule out any other causes of anemia. Recommend outpatient workup by PCP if patient close to discharge. Per Dr Margie Billetobbs, she is asymptomatic, no dizziness lightheadedness, dyspnea or weakness. MCV is normal. - d/w Dr Margie Billetobbs in detail.    RAI,RIPUDEEP M.D. [facility] 10/04/2014, 4:49 PM  Pager: [phone]

## 2014-10-04 NOTE — [facility] Group Notes (Signed)
[facility] LCSW Group Therapy  [facility] 1:15 - 2:30 PM  10/04/2014   Type of Therapy:  Group Therapy  Participation Level: Active  Participation Quality:  Attentive  Affect:  Appropriate  Cognitive:  Appropriate  Insight:  Developing/Improving   Engagement in Therapy:  Developing/Improving   Modes of Intervention:  Discussion, Education, Exploration, Problem-Solving, Rapport Building, Support   Summary of Progress/Problems:   Patient was attentive to speaker from the [employer] [facility] [employer] as he shared his story of dealing with mental health/substance abuse issues and overcoming it by working a recovery program.  Patient expressed interest in their programs and services and received information on their agency.    Dominique Bennett 10/04/2014      

## 2014-10-04 NOTE — Progress Notes (Addendum)
Eleah in day room playing card with peers at the beginning of the shift. Her mood and affects appropriate. She seemed excited about discharge plan. "They found me a treatment place I will live for 13 month, I'm happy an excited". She denied SI/HI and denied Hallucinations. Her speech clear and she appears to be doing better. Writer encouraged and supported patient. Patient receptive to encouragement and support. Q 15 minute check continues as ordered to maintain safety.  2240: Writer noticed there was an order written for occult stool earlier today that hasn't been done. Talked to patient about the order and see if she could give us sample. Patient stated that she hasn't been able to pass any stool and that she doesn't want any stool softener. Patient made aware of the order but unable to provide sample at this time.

## 2014-10-04 NOTE — Progress Notes (Signed)
Patient ID: Dominique Bennett, female   DOB: 08/21/1985, 29 y.o.   MRN: [id number] Completed self inventory and gave herself a 9 for depression and hopelessness, and a 10 re anxiety. She is feeling better now since completing form knowing she is going into a treatment program.

## 2014-10-04 NOTE — Progress Notes (Signed)
Patient ID: Dominique CureRobin Bennett, female   DOB: 1985/09/12, 29 y.o.   MRN: [id number] D-Frequently to medicine window with med requests. Complains of low back pain and was given her pain pat h as scheduled and then an hour later states no relief with patch and given her a prn Torodol. States her pain is a 9 and the best it gets with medications is 5. At home without meds states she does nothing just stays in the bed.Flat affect good eye contact. Denies any thoughts to hurt self. Wants to go to a treatment program from here, currently homeless. A-Support offered medications as ordered monitored for safety. R-No complaints at this time. Saw Dr and told Clinical research associatewriter [facility] and is glad about that and looking forward to leaving.

## 2014-10-04 NOTE — Progress Notes (Signed)
Patient ID: Dominique Bennett, female   DOB: 01-Jun-1985, 29 y.o.   MRN: [id number] [facility] MD Progress Note  10/04/2014 4:02 PM Venise Ellingwood  MRN:  [id number] Subjective:  Although continues to feel depressed , anxious, today admits that she is better. She is noticeably more future oriented, and today discussing disposition options , less focused on somatic complaints, pain, and less focused on medications. States " I know I have become addicted to those pills. I was using them to feel calm, not care about things, and not just for pain. I  know they have been bad for me", but expresses worry that non narcotic medications will not address her pain as well.   Objective: Patient seen and chart reviewed.Discussed patient with treatment team.  Patient has improved over the past day to two days in particular. She is noted to be less depressed, less labile in affect, no longer tearful, and more focused on discharge. Still reports that pain medications are not sufficient to address her pain completely, but does not appear uncomfortable, or in any visible distress, and has been noted to be sitting calmly, engaging appropriately with select peers and when on the phone. She does continue to report feeling sad, anxious, and remains ruminative. She has expressed high level of interest in going to [facility], as it has become clear that she cannot live with her father after discharge - as per SW, who spoke with father via phone, father does not want patient to live with him after discharge, but willing to provide support and transportation if needed. As noted, today better able to recognize substance dependence and the negative impact it has had on her life - responsive to support, empathy, encouragement.  Denies medication side effects .   Principal Problem: Bipolar disorder ,  R/o substance induced ( opioid,bzd) bipolar disorder verus Bipolar I . Diagnosis:   Patient Active Problem List   Diagnosis Date  Noted  . Bipolar affective disorder [F31.9]   . Hematuria [R31.9]   . PTSD (post-traumatic stress disorder) [F43.10] 10/01/2014  . Opioid use disorder, moderate, dependence [F11.20] 10/01/2014  . 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 Bipolar disorder [F31.9] 10/01/2014   Total Time spent with patient: 25 minutes    Past Medical History:  Past Medical History  Diagnosis Date  . Migraines   . Asthma   . Chronic back pain   . Depression   . Anxiety   . Bipolar 1 disorder   . Sleep disorder     Past Surgical History  Procedure Laterality Date  . Tonsillectomy    . Cesarean section    . Lithotripsy     Family History: History reviewed. No pertinent family history. Social History:  History  Alcohol Use No     History  Drug Use  . Yes    History   Social History  . Marital Status: Married    Spouse Name: N/A  . Number of Children: N/A  . Years of Education: N/A   Social History Main Topics  . Smoking status: Current Every Day Smoker -- 2.00 packs/day    Types: Cigarettes  . Smokeless tobacco: Not on file  . Alcohol Use: No  . Drug Use: Yes  . Sexual Activity: Not on file   Other Topics Concern  . None   Social History Narrative   Additional History:    Sleep: fair   Appetite:  improving     Musculoskeletal: Strength &  Muscle Tone: within normal limits Gait & Station: normal Patient leans: N/A   Psychiatric Specialty Exam: Physical Exam  ROS- no report of pelvic pain or hematuria today . No chills, no fever, no dysuria. No flank pain.  Blood pressure 106/56, pulse 95, temperature 98 F (36.7 C), temperature source Oral, resp. rate 20, height 5' 4.17" (1.63 m), weight 155 lb (70.308 kg), last menstrual period 09/03/2014, SpO2 100 %.Body mass index is 26.46 kg/(m^2).  General Appearance: improved grooming   Eye Contact::  Good  Speech:  Normal Rate  Volume:  Normal  Mood: improved, still depressed,  but clearly improved and less labile affect compared to admission  Affect:  Less depressed, less labile  Thought Process:  Linear   Orientation:  Full (Time, Place, and Person)  Thought Content:  No hallucinations, no delusions, not internally preoccupied   Suicidal Thoughts:  No-at this time denies any thoughts of hurting self and  contracts for safety on unit   Homicidal Thoughts:  No  Memory:  Immediate;   Fair Recent;   Fair Remote;   Fair  Judgement:  Fair  Insight:  improving   Psychomotor Activity:  Normal, no current tremors or agitation /restlessness noted   Concentration:  Fair  Recall:  [employer] of Knowledge:Fair  Language: Fair  Akathisia:  No  Handed:  Right  AIMS (if indicated):     Assets:  Desire for Improvement Physical Health  ADL's: fair   Cognition: WNL  Sleep:  Number of Hours: 6.75     Current Medications: Current Facility-Administered Medications  Medication Dose Route Frequency Provider Last Rate Last Dose  . acetaminophen (TYLENOL) tablet 650 mg  650 mg Oral Q6H PRN Shuvon B Rankin, NP      . acyclovir ointment (ZOVIRAX) 5 %   Topical 5 X Daily Suly Vukelich A Davene Jobin, MD      . alum & mag hydroxide-simeth (MAALOX/MYLANTA) 200-200-20 MG/5ML suspension 30 mL  30 mL Oral Q4H PRN Shuvon B Rankin, NP      . cloNIDine (CATAPRES) tablet 0.1 mg  0.1 mg Oral BH-qamhs Saramma Eappen, MD   0.1 mg at 10/04/14 0801   Followed by  . [START ON 10/06/2014] cloNIDine (CATAPRES) tablet 0.1 mg  0.1 mg Oral QAC breakfast Saramma Eappen, MD      . dicyclomine (BENTYL) tablet 20 mg  20 mg Oral Q6H PRN Ursula Alert, MD   20 mg at 10/03/14 0817  . divalproex (DEPAKOTE ER) 24 hr tablet 500 mg  500 mg Oral q morning - 10a Jenne Campus, MD   500 mg at 10/04/14 1032  . divalproex (DEPAKOTE ER) 24 hr tablet 750 mg  750 mg Oral QHS Jenne Campus, MD   750 mg at 10/03/14 2129  . gabapentin (NEURONTIN) capsule 600 mg  600 mg Oral TID Jenne Campus, MD   600 mg at 10/04/14 1215   . ketorolac (TORADOL) tablet 10 mg  10 mg Oral Q6H PRN Nicholaus Bloom, MD   10 mg at 10/04/14 0901  . lidocaine (LIDODERM) 5 % 1 patch  1 patch Transdermal Daily Jenne Campus, MD   1 patch at 10/04/14 0802  . magnesium hydroxide (MILK OF MAGNESIA) suspension 30 mL  30 mL Oral Daily PRN Shuvon B Rankin, NP      . methocarbamol (ROBAXIN) tablet 500 mg  500 mg Oral Q6H PRN Nicholaus Bloom, MD   500 mg at 10/04/14 1512  . neomycin-bacitracin-polymyxin (NEOSPORIN)  ointment   Topical TID Kerrie Buffalo, NP   1 application at 17/00/17 0810  . nicotine polacrilex (NICORETTE) gum 2 mg  2 mg Oral PRN Jenne Campus, MD   2 mg at 10/03/14 2123  . prazosin (MINIPRESS) capsule 1 mg  1 mg Oral QHS Nicholaus Bloom, MD   1 mg at 10/03/14 2127  . thiamine (VITAMIN B-1) tablet 100 mg  100 mg Oral Daily Shuvon B Rankin, NP   100 mg at 10/04/14 0800  . traZODone (DESYREL) tablet 50 mg  50 mg Oral QHS,MR X 1 Laverle Hobby, PA-C   50 mg at 10/03/14 2126    Lab Results:  Results for orders placed or performed during the hospital encounter of 09/29/14 (from the past 48 hour(s))  hCG, serum, qualitative     Status: None   Collection Time: 10/03/14  6:17 AM  Result Value Ref Range   Preg, Serum NEGATIVE NEGATIVE    Comment:        THE SENSITIVITY OF THIS METHODOLOGY IS >10 mIU/mL. Performed at [facility]   Urinalysis, Routine w reflex microscopic (not at [facility])     Status: Abnormal   Collection Time: 10/03/14  1:07 PM  Result Value Ref Range   Color, Urine YELLOW YELLOW   APPearance CLOUDY (A) CLEAR   Specific Gravity, Urine 1.014 1.005 - 1.030   pH 6.5 5.0 - 8.0   Glucose, UA NEGATIVE NEGATIVE mg/dL   Hgb urine dipstick LARGE (A) NEGATIVE   Bilirubin Urine NEGATIVE NEGATIVE   Ketones, ur NEGATIVE NEGATIVE mg/dL   Protein, ur NEGATIVE NEGATIVE mg/dL   Urobilinogen, UA 0.2 0.0 - 1.0 mg/dL   Nitrite NEGATIVE NEGATIVE   Leukocytes, UA TRACE (A) NEGATIVE    Comment: Performed at [facility]  Urine microscopic-add on     Status: Abnormal   Collection Time: 10/03/14  1:07 PM  Result Value Ref Range   Squamous Epithelial / LPF MANY (A) RARE   WBC, UA 3-6 <3 WBC/hpf   RBC / HPF 3-6 <3 RBC/hpf    Comment: Performed at [facility]  CBC     Status: Abnormal   Collection Time: 10/03/14  7:33 PM  Result Value Ref Range   WBC 8.1 4.0 - 10.5 K/uL   RBC 3.23 (L) 3.87 - 5.11 MIL/uL   Hemoglobin 10.7 (L) 12.0 - 15.0 g/dL   HCT 32.1 (L) 36.0 - 46.0 %   MCV 99.4 78.0 - 100.0 fL   MCH 33.1 26.0 - 34.0 pg   MCHC 33.3 30.0 - 36.0 g/dL   RDW 12.3 11.5 - 15.5 %   Platelets 257 150 - 400 K/uL    Comment: Performed at [place] metabolic panel     Status: Abnormal   Collection Time: 10/03/14  7:33 PM  Result Value Ref Range   Sodium 139 135 - 145 mmol/L   Potassium 3.9 3.5 - 5.1 mmol/L   Chloride 103 101 - 111 mmol/L   CO2 28 22 - 32 mmol/L   Glucose, Bld 113 (H) 65 - 99 mg/dL   BUN 14 6 - 20 mg/dL   Creatinine, Ser 0.81 0.44 - 1.00 mg/dL   Calcium 9.7 8.9 - 10.3 mg/dL   GFR calc non Af Amer >60 >60 mL/min   GFR calc Af Amer >60 >60 mL/min    Comment: (NOTE) The eGFR has been calculated using the CKD EPI equation. This  calculation has not been validated in all clinical situations. eGFR's persistently <60 mL/min signify possible Chronic Kidney Disease.    Anion gap 8 5 - 15    Comment: Performed at [facility]    Physical Findings: AIMS: Facial and Oral Movements Muscles of Facial Expression: None, normal Lips and Perioral Area: None, normal Jaw: None, normal Tongue: None, normal,Extremity Movements Upper (arms, wrists, hands, fingers): None, normal Lower (legs, knees, ankles, toes): None, normal, Trunk Movements Neck, shoulders, hips: None, normal, Overall Severity Severity of abnormal movements (highest score from questions above): None, normal Incapacitation due to abnormal  movements: None, normal Patient's awareness of abnormal movements (rate only patient's report): No Awareness, Dental Status Current problems with teeth and/or dentures?: No Does patient usually wear dentures?: No  CIWA:  CIWA-Ar Total: 0 COWS:  COWS Total Score: 0  Assessment  Patient remains depressed, but affect is more reactive, and she has become less labile and less tearful. She is also less intensely focused on pain issues and medications. She does not appear to be in any acute distress . She is motivated in going to [facility] , and having a specific plan seems to be helping to relieve some of her significant anxiety. She is more insightful regarding opiate dependence and subsequent psychosocial losses. At admission, she tended to externalize, but today better able to link her addiction to losses . Of note , although pain is decreased, she has ongoing hematuria, and is now slightly anemic- will discuss with Hospitalist Service .      Treatment Plan Summary: Daily contact with patient to assess and evaluate symptoms and progress in treatment and Medication management  .  Continue Depakote ER to  500 mg QAM and 750 mgrs QHS  for mood  Disorder, lability. Continue  Risperidone to 1 mgrs QHS - for Mood Lability, PTSD symptoms Continue Trazodone 50 mg  QHS for insomnia  Continue Prazosin 1 mg QHS  For PTSD    nightmares. Continue Neurontin to 600 mgrs TID   for anxiety  And chronic pain. Has tolerated Toradol PRNs well for pain, no side effects reported  Interested in going to [facility] after discharge- [place] working on disposition planning, father may transport to [employer]. Adult nurse consult regarding hematuria.( see hospitalist notes in chart )   Medical Decision Making:  Review of Psycho-Social Stressors (1), Review or order clinical lab tests (1), Review of Last Therapy Session (1), Review of Medication Regimen & Side Effects (2) and Review of New  Medication or Change in Dosage (2)     Jeron Grahn MD 10/04/2014, 4:02 PM

## 2014-10-04 NOTE — BHH Suicide Risk Assessment (Signed)
Patient advised of interest in discharging [facility] on Friday.  CSW spoke with patient's father to see if he can assist with transportation. Father to call back with answer.

## 2014-10-04 NOTE — Progress Notes (Signed)
Patient ID: Dominique CureRobin Gossen, female   DOB: 11-04-1985, 29 y.o.   MRN: [id number] New order for collection of stool for occult blood. Found her a hat for her toilet, and asked her if she is able to have a bowel movement today to let staff know and they can do the collection. She states she has always had difficulty moving bowels, and goes for weeks without using defecating. Unlikely she will use the toilet for stool tonight but hat in her room is she does.

## 2014-10-05 LAB — IRON AND TIBC
Iron: 51 ug/dL (ref 28–170)
Saturation Ratios: 15 % (ref 10.4–31.8)
TIBC: 344 ug/dL (ref 250–450)
UIBC: 293 ug/dL

## 2014-10-05 LAB — FOLATE: Folate: 12.1 ng/mL (ref >5.9)

## 2014-10-05 LAB — FERRITIN: Ferritin: 22 ng/mL (ref 11–307)

## 2014-10-05 LAB — VITAMIN B12: Vitamin B-12: 379 pg/mL (ref 180–914)

## 2014-10-05 MED ORDER — LIDOCAINE 5 % EX PTCH: 1.0000 | MEDICATED_PATCH | Freq: Every day | CUTANEOUS | Status: DC

## 2014-10-05 MED ORDER — DIVALPROEX SODIUM ER 250 MG PO TB24: 750.0000 mg | ORAL_TABLET | Freq: Every day | ORAL | Status: DC

## 2014-10-05 MED ORDER — PRAZOSIN HCL 1 MG PO CAPS: 1.0000 mg | ORAL_CAPSULE | Freq: Every day | ORAL | Status: DC

## 2014-10-05 MED ORDER — TRAZODONE HCL 50 MG PO TABS: 50.0000 mg | ORAL_TABLET | Freq: Every evening | ORAL | Status: DC | PRN

## 2014-10-05 MED ORDER — THIAMINE HCL 100 MG PO TABS: 100.0000 mg | ORAL_TABLET | Freq: Every day | ORAL | Status: DC

## 2014-10-05 MED ORDER — NICOTINE POLACRILEX 2 MG MT GUM: 2.0000 mg | CHEWING_GUM | OROMUCOSAL | Status: DC | PRN

## 2014-10-05 MED ORDER — GABAPENTIN 300 MG PO CAPS: 600.0000 mg | ORAL_CAPSULE | Freq: Three times a day (TID) | ORAL | Status: DC

## 2014-10-05 MED ORDER — DIVALPROEX SODIUM ER 500 MG PO TB24: 500.0000 mg | ORAL_TABLET | Freq: Every morning | ORAL | Status: DC

## 2014-10-05 NOTE — Tx Team (Addendum)
Interdisciplinary Treatment Plan Update (Adult)  Date:  10/05/2014  Time Reviewed:  9:38 AM   Progress in Treatment: Attending groups: Patient is attending groups. Participating in groups:  Patient engages in discussion Taking medication as prescribed:  Patient is taking medications Tolerating medication:  Patient is tolerating medications Family/Significant othe contact made:  Yes, collateral contact with father. Patient understands diagnosis:Yes, patient understands diagnosis and need for treatment Discussing patient identified problems/goals with staff:  Yes, patient is able to express goals/problems Medical problems stabilized or resolved:  Yes Denies suicidal/homicidal ideation:No.  Patient endorses SI but contact for safety. Issues/concerns per patient self-inventory:   Other:  Discharge Plan or Barriers:  Patient is discharging to [facility] in [place], [place].  Follow up scheduled with [facility]  Reason for Continuation of Hospitalization:  Comments:  Additional comments:  Patient and CSW reviewed Patient Discharge Process Letter/Patient Involvement Form.  Patient verbalized understanding and signed form.  Patient and CSW also reviewed and identified patient's goals and treatment plan.  Patient verbalized understanding and agreed to plan.  Estimated length of stay: Discharge today  New goal(s):  Review of initial/current patient goals per problem list:  Please see plan of careInterdisciplinary Treatment Plan Update (Adult)  Attendees: Patient 10/05/2014 9:38 AM   Family:   10/05/2014 9:38 AM   Physician:  Nehemiah MassedFernando Cobos, MD 10/05/2014 9:38 AM   Nursing:   Carney LivingBrittney Tyson, RN 10/05/2014 9:38 AM   Clinical Social Worker:  Juline PatchQuylle Jonothan Heberle, LCSW 10/05/2014 9:38 AM   Clinical Social Worker:  Samuella BruinKristin Drinkard, LCSW-A 10/05/2014 9:38 AM   Case Manager:  Onnie BoerJennifer Clark, RN 10/05/2014 9:38 AM   Other:  Linton RumpAmanda Lawson, RN 10/05/2014 9:38 AM  Other:   10/05/2014  9:38 AM   Other:   10/05/2014 9:38 AM   Other:  10/05/2014 9:38 AM   Other:  10/05/2014 9:38 AM   Other:   10/05/2014 9:38 AM   Other:   10/05/2014 9:38 AM   Other:  10/05/2014 9:38 AM   Other:   10/05/2014 9:38 AM    Scribe for Treatment Team:   Wynn BankerHodnett, Jamela Cumbo Hairston, 10/05/2014   9:38 AM

## 2014-10-05 NOTE — Progress Notes (Signed)
  [facility] :  Will you be returning to the same living situation after discharge:  No.  Patient is discharging to [facility]. At discharge, do you have transportation home?: Yes,  Patient's father is transporting her to [place]. Do you have the ability to pay for your medications: No.  Patient needs assistance with indigent medications   Release of information consent forms completed and in the chart;  Patient's signature needed at discharge.  Patient to Follow up at: Follow-up Information    Follow up with [facility] On 10/10/2014.   Why:  Appt at 9 AM for intake assessment.  If you feel you need to be seen sooner, you may present to the walk in clinic on Monday June 6 between 9 AM and 4 PM   Contact information:   [address] [place]  [address] Phone:  [phone] Fax:  [phone]      Patient denies SI/HI: Patient no longer endorsing SI/HI or other thoughts of self harm.   Safety Planning and Suicide Prevention discussed: .Reviewed with all patients during discharge planning group   Have you used any form of tobacco in the last 30 days? (Cigarettes, Smokeless Tobacco, Cigars, and/or Pipes): Yes  Has patient been referred to the Quitline?:  Patient declined referral to Quitline.   Wynn BankerHodnett, Dominique Micheli Hairston 10/05/2014, 9:36 AM

## 2014-10-05 NOTE — BHH Suicide Risk Assessment (Signed)
[facility] Discharge Suicide Risk Assessment   Demographic Factors:  29 year old female, has 4 children, who live with extended family members  Total Time spent with patient: 30 minutes  Musculoskeletal: Strength & Muscle Tone: within normal limits Gait & Station: normal Patient leans: N/A  Psychiatric Specialty Exam: Physical Exam  ROS  Blood pressure 108/58, pulse 76, temperature 97.8 F (36.6 C), temperature source Oral, resp. rate 16, height 5' 4.17" (1.63 m), weight 155 lb (70.308 kg), last menstrual period 09/03/2014, SpO2 100 %.Body mass index is 26.46 kg/(m^2).  General Appearance: improved grooming  Eye Contact::  Good  Speech:  Normal Rate409  Volume:  Normal  Mood:  improved, fuller range of affect   Affect:  more reactive, smiling appropriately  Thought Process:  Goal Directed and Linear  Orientation:  Full (Time, Place, and Person)  Thought Content:  denies hallucinations, no delusions, not internally preoccupied   Suicidal Thoughts:  No  Homicidal Thoughts:  No  Memory: Recent and Remote grossly intact  Judgement:  Other:  improved   Insight:  Good  Psychomotor Activity:  Normal  Concentration:  Good  Recall:  Good  Fund of Knowledge:Good  Language: Good  Akathisia:  Negative  Handed:  Right  AIMS (if indicated):     Assets:  Desire for Improvement Resilience  Sleep:  Number of Hours: 6.25  Cognition: WNL  ADL's: improved    Have you used any form of tobacco in the last 30 days? (Cigarettes, Smokeless Tobacco, Cigars, and/or Pipes): Yes  Has this patient used any form of tobacco in the last 30 days? (Cigarettes, Smokeless Tobacco, Cigars, and/or Pipes)  Counseled her regarding benefits of smoking cessation.   Mental Status Per Nursing Assessment::   On Admission:  Self-harm thoughts, Self-harm behaviors  Current Mental Status by Physician: At this time patient much improved compared to admission- mood improved, affect brighter and less constricted, less  somatically focused, no thought disorder, no SI or HI, no psychotic symptoms, 0x3   Loss Factors: Recent break up with Boyfriend, not having custody of children  Historical Factors: History of Depression, History of opiate abuse , history of chronic pain  Risk Reduction Factors:   Sense of responsibility to family and Positive coping skills or problem solving skills  Continued Clinical Symptoms:  As noted, patient is improved compared to admission- less depressed, fuller range of affect, no SI or HI, less somatically focused, more future oriented, currently motivated in going to Rehab. Labs reviewed- patient aware of Hgb in Urine- has had history of hematuria- advised to follow up with PCP /urologist about this issue.  Cognitive Features That Contribute To Risk:  No gross cognitive deficits noted upon discharge. Is alert , attentive, and oriented x 3    Suicide Risk:  Mild:  Suicidal ideation of limited frequency, intensity, duration, and specificity.  There are no identifiable plans, no associated intent, mild dysphoria and related symptoms, good self-control (both objective and subjective assessment), few other risk factors, and identifiable protective factors, including available and accessible social support.  Principal Problem: Bipolar disorder Discharge Diagnoses:  Patient Active Problem List   Diagnosis Date Noted  . Bipolar affective disorder [F31.9]   . Hematuria [R31.9]   . PTSD (post-traumatic stress disorder) [F43.10] 10/01/2014  . Opioid use disorder, moderate, dependence [F11.20] 10/01/2014  . 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 Bipolar disorder [F31.9] 10/01/2014    Follow-up Information    Follow up with [facility] On 10/10/2014.   Why:  Appt at 9 AM for intake assessment.  If you feel you need to be seen sooner, you may present to the walk in clinic on Monday June 6 between 9 AM and 4 PM   Contact  information:   [address] [place]  [address] Phone:  [phone] Fax:  [phone]      Plan Of Care/Follow-up recommendations:  Activity:  as tolerated Diet:  Regular Tests:  NA Other:  See below  Is patient on multiple antipsychotic therapies at discharge:  No   Has Patient had three or more failed trials of antipsychotic monotherapy by history:  No  Recommended Plan for Multiple Antipsychotic Therapies: NA   Patient is being discharged from unit in good spirits. Plans [facility] - father will transport her there . She is encouraged to attend 12 step meetings regularly. Follow up with PCP- [facility]    [place] 10/05/2014, 9:38 AM

## 2014-10-05 NOTE — Progress Notes (Signed)
Patient ID: Dominique Bennett, female   DOB: 10/16/1985, 29 y.o.   MRN: [id number] Patient discharged per physician order; patient denies SI/HI and A/V hallucinations; patient received samples, prescriptions, and copy of AVS after it was reviewed; patient had no other questions or concerns at this time; patient verbalized and signed that she received all belongings; patient left the unit ambulatory

## 2014-10-05 NOTE — Progress Notes (Signed)
Recreation Therapy Notes  Date: 06.03.16 Time: 9:30am Location: 300 Group Room  Group Topic: Stress Management  Goal Area(s) Addresses:  Patient will verbalize importance of using healthy stress management.  Patient will identify positive emotions associated with healthy stress management.   Intervention: Stress Management  Activity :  Progressive Muscle Relaxation.  LRT introduced patients to stress management technique of progressive muscle relaxation.  Bennett script was used to deliver the technique and patients were asked to follow script read by LRT to engage in practicing the stress management technique.    Education:  Stress Management, Discharge Planning.   Education Outcome: Acknowledges edcuation/In group clarification offered  Clinical Observations/Feedback: Patient did not attend group.    Dominique Bennett, LRT/CTRS         Dominique Bennett 10/05/2014 1:51 PM 

## 2014-10-05 NOTE — Discharge Summary (Signed)
Physician Discharge Summary Note  Patient:  Dominique Bennett is an 29 y.o., female MRN:  [id number] DOB:  1985/07/29 Patient phone:  [phone] (home)  Patient address:   [place] [address],  Total Time spent with patient: 30 minutes  Date of Admission:  09/29/2014 Date of Discharge: 10/05/14  Reason for Admission:  Mood stabilization treatments   Principal Problem: Bipolar disorder Discharge Diagnoses: Patient Active Problem List   Diagnosis Date Noted  . Bipolar affective disorder [F31.9]   . Hematuria [R31.9]   . PTSD (post-traumatic stress disorder) [F43.10] 10/01/2014  . Opioid use disorder, moderate, dependence [F11.20] 10/01/2014  . 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 Bipolar disorder [F31.9] 10/01/2014    Musculoskeletal: Strength & Muscle Tone: within normal limits Gait & Station: normal Patient leans: N/A  Psychiatric Specialty Exam: Physical Exam  Psychiatric: She has a normal mood and affect. Her speech is normal and behavior is normal. Judgment and thought content normal. Cognition and memory are normal.    Review of Systems  Constitutional: Negative.   HENT: Negative.   Eyes: Negative.   Respiratory: Negative.   Cardiovascular: Negative.   Gastrointestinal: Negative.   Genitourinary: Negative.   Musculoskeletal: Negative.   Skin: Negative.   Neurological: Negative.   Endo/Heme/Allergies: Negative.   Psychiatric/Behavioral: Negative for depression, suicidal ideas, hallucinations, memory loss and substance abuse. The patient is not nervous/anxious and does not have insomnia.     Blood pressure 108/58, pulse 76, temperature 97.8 F (36.6 C), temperature source Oral, resp. rate 16, height 5' 4.17" (1.63 m), weight 70.308 kg (155 lb), last menstrual period 09/03/2014, SpO2 100 %.Body mass index is 26.46 kg/(m^2).  See Physician SRA     Have you used any form of tobacco in the last 30  days? (Cigarettes, Smokeless Tobacco, Cigars, and/or Pipes): Yes  Has this patient used any form of tobacco in the last 30 days? (Cigarettes, Smokeless Tobacco, Cigars, and/or Pipes) Yes, she was counseled regarding benefits of smoking cessation and encouraged to use nicotine gum after discharge.   Past Medical History:  Past Medical History  Diagnosis Date  . Migraines   . Asthma   . Chronic back pain   . Depression   . Anxiety   . Bipolar 1 disorder   . Sleep disorder     Past Surgical History  Procedure Laterality Date  . Tonsillectomy    . Cesarean section    . Lithotripsy     Family History: History reviewed. No pertinent family history. Social History:  History  Alcohol Use No     History  Drug Use  . Yes    History   Social History  . Marital Status: Married    Spouse Name: N/A  . Number of Children: N/A  . Years of Education: N/A   Social History Main Topics  . Smoking status: Current Every Day Smoker -- 2.00 packs/day    Types: Cigarettes  . Smokeless tobacco: Not on file  . Alcohol Use: No  . Drug Use: Yes  . Sexual Activity: Not on file   Other Topics Concern  . None   Social History Narrative    Past Psychiatric History: Hospitalizations:  Outpatient Care:  Substance Abuse Care:  Self-Mutilation:  Suicidal Attempts:  Violent Behaviors:   Risk to Self: Is patient at risk for suicide?: Yes What has been your use of drugs/alcohol within the last 12 months?: Smoked marijuana monthly. Risk to  Others:   Prior Inpatient Therapy:   Prior Outpatient Therapy:    Level of Care:  OP  Hospital Course:    Dominique Bennett is an 29 y.o. female that as assessed this day via tele assessment after presenting to [facility] reporting SI with gesture. Pt stated in assessment she heard voices telling her she was worthless and to cut herself, so she cut herself with a pen and knife on her wrist and her stomach. Pt stated she has had a long hx of depression and SI  in past, but has never acted on her SI. Pt stated she has had conflict with her boyfriend, is now homeless, has no family support, has lost her children (4 children have been adopted by family members and a friend), she has chronic back pain, cannot get disability, and has no insurance. Pt stated, "I want to die. I don't wanto be on this earth anymore." Pt denies HI. Pt admits to having command hallucinations telling her to cut herself. Pt denies visual hallucinations. No delusions noted. Pt admits to depressive sx, anxiety, and daily panic attacks. Pt stated she used to go to [facility] and a pain management clinic for her medications and for Suboxone, but can no longer afford and has no transportation. Pt denies current SA. Pt admits to using Xanax in past.         [name] was admitted to the adult 400 unit where she was evaluated and her symptoms were identified. Medication management was discussed and implemented. The patient was not taking any psychiatric medications prior to admission. Patient was placed on the clonidine protocol for opiate detox as she was using percocet. On admission her urine drug screen was positive for opiates. Patient was also started on Depakote ER for improved stability of mood. There was concern that many of her psychiatric symptoms were substance induced. Patient was started on Minipress 1 mg at bedtime for complaints of nightmares.  She was encouraged to participate in unit programming. Medical problems were identified and treated appropriately. An internal medicine consult was obtained due to patient's complaint of possibly having kidney stones. Patient had requested going to the ED because "They give me dilaudid.", which raised concern from staff about possible manipulation to obtain opiate medications.  Home medication was restarted as needed.  She was evaluated each day by a clinical provider to ascertain the patient's response to treatment.  Improvement was  noted by the patient's report of decreasing symptoms, improved sleep and appetite, affect, medication tolerance, behavior, and participation in unit programming.  The patient was asked each day to complete a self inventory noting mood, mental status, pain, new symptoms, anxiety and concerns.         She responded well to medication and being in a therapeutic and supportive environment. She admitted to buying benzos and opiates off the street to address chronic pain and anxiety. Patient tended to worry about how she would manage without these medications. As she progressed her treatment the patient was noted to be less labile and depressed. Positive and appropriate behavior was noted and the patient was motivated for recovery.  She worked closely with the treatment team and case manager to develop a discharge plan with appropriate goals. Coping skills, problem solving as well as relaxation therapies were also part of the unit programming. Patient was currently homeless. Her father declined to take her in after discharge. The patient went to the [facility] after discharge.  By the day of discharge she was in much improved condition than upon admission.  Symptoms were reported as significantly decreased or resolved completely. The patient denied SI/HI and voiced no AVH. She was motivated to continue taking medication with a goal of continued improvement in mental health.  Analys Ryden was discharged home with a plan to follow up as noted below. The patient was provided with two week supply of sample medications and prescriptions at time of discharge. She left [facility] in stable condition with all belongings returned to her.   Consults:  None  Significant Diagnostic Studies:  Chemistry panel, Iron panel, CBC, UA  Discharge Vitals:   Blood pressure 108/58, pulse 76, temperature 97.8 F (36.6 C), temperature source Oral, resp. rate 16, height 5' 4.17" (1.63 m), weight 70.308 kg (155 lb), last  menstrual period 09/03/2014, SpO2 100 %. Body mass index is 26.46 kg/(m^2). Lab Results:   Results for orders placed or performed during the hospital encounter of 09/29/14 (from the past 72 hour(s))  Urinalysis with microscopic     Status: Abnormal   Collection Time: 10/02/14  2:33 PM  Result Value Ref Range   Color, Urine YELLOW YELLOW   APPearance CLOUDY (A) CLEAR   Specific Gravity, Urine 1.008 1.005 - 1.030   pH 6.5 5.0 - 8.0   Glucose, UA NEGATIVE NEGATIVE mg/dL   Hgb urine dipstick MODERATE (A) NEGATIVE   Bilirubin Urine NEGATIVE NEGATIVE   Ketones, ur NEGATIVE NEGATIVE mg/dL   Protein, ur NEGATIVE NEGATIVE mg/dL   Urobilinogen, UA 0.2 0.0 - 1.0 mg/dL   Nitrite NEGATIVE NEGATIVE   Leukocytes, UA TRACE (A) NEGATIVE   WBC, UA 3-6 <3 WBC/hpf   RBC / HPF 3-6 <3 RBC/hpf   Bacteria, UA FEW (A) RARE   Squamous Epithelial / LPF FEW (A) RARE    Comment: Performed at [facility]  Urine culture     Status: None   Collection Time: 10/02/14  2:33 PM  Result Value Ref Range   Specimen Description      URINE, CLEAN CATCH Performed at [facility]      Normal Performed at [place]      8,000 COLONIES/ML Performed at [facility]    Culture      INSIGNIFICANT GROWTH Performed at [facility]    Report Status 10/04/2014 FINAL   hCG, serum, qualitative     Status: None   Collection Time: 10/03/14  6:17 AM  Result Value Ref Range   Preg, Serum NEGATIVE NEGATIVE    Comment:        THE SENSITIVITY OF THIS METHODOLOGY IS >10 mIU/mL. Performed at [facility]   Urine culture     Status: None   Collection Time: 10/03/14  1:07 PM  Result Value Ref Range   Specimen Description      URINE, CLEAN CATCH Performed at [place] Performed at [place] Performed at [facility]     Culture NO GROWTH Performed at [facility]     Report Status 10/04/2014 FINAL   Urinalysis, Routine w reflex microscopic (not at [facility])     Status: Abnormal   Collection Time: 10/03/14  1:07 PM  Result Value Ref Range   Color, Urine YELLOW YELLOW  APPearance CLOUDY (A) CLEAR   Specific Gravity, Urine 1.014 1.005 - 1.030   pH 6.5 5.0 - 8.0   Glucose, UA NEGATIVE NEGATIVE mg/dL   Hgb urine dipstick LARGE (A) NEGATIVE   Bilirubin Urine NEGATIVE NEGATIVE   Ketones, ur NEGATIVE NEGATIVE mg/dL   Protein, ur NEGATIVE NEGATIVE mg/dL   Urobilinogen, UA 0.2 0.0 - 1.0 mg/dL   Nitrite NEGATIVE NEGATIVE   Leukocytes, UA TRACE (A) NEGATIVE    Comment: Performed at [facility]  Urine microscopic-add on     Status: Abnormal   Collection Time: 10/03/14  1:07 PM  Result Value Ref Range   Squamous Epithelial / LPF MANY (A) RARE   WBC, UA 3-6 <3 WBC/hpf   RBC / HPF 3-6 <3 RBC/hpf    Comment: Performed at [facility]  CBC     Status: Abnormal   Collection Time: 10/03/14  7:33 PM  Result Value Ref Range   WBC 8.1 4.0 - 10.5 K/uL   RBC 3.23 (L) 3.87 - 5.11 MIL/uL   Hemoglobin 10.7 (L) 12.0 - 15.0 g/dL   HCT 32.1 (L) 36.0 - 46.0 %   MCV 99.4 78.0 - 100.0 fL   MCH 33.1 26.0 - 34.0 pg   MCHC 33.3 30.0 - 36.0 g/dL   RDW 12.3 11.5 - 15.5 %   Platelets 257 150 - 400 K/uL    Comment: Performed at [place] metabolic panel     Status: Abnormal   Collection Time: 10/03/14  7:33 PM  Result Value Ref Range   Sodium 139 135 - 145 mmol/L   Potassium 3.9 3.5 - 5.1 mmol/L   Chloride 103 101 - 111 mmol/L   CO2 28 22 - 32 mmol/L   Glucose, Bld 113 (H) 65 - 99 mg/dL   BUN 14 6 - 20 mg/dL   Creatinine, Ser 0.81 0.44 - 1.00 mg/dL   Calcium 9.7 8.9 - 10.3 mg/dL   GFR calc non Af Amer >60 >60 mL/min   GFR calc Af Amer >60 >60 mL/min    Comment: (NOTE) The eGFR has been calculated using  the CKD EPI equation. This calculation has not been validated in all clinical situations. eGFR's persistently <60 mL/min signify possible Chronic Kidney Disease.    Anion gap 8 5 - 15    Comment: Performed at [facility]  Vitamin B12     Status: None   Collection Time: 10/04/14  7:15 PM  Result Value Ref Range   Vitamin B-12 379 180 - 914 pg/mL    Comment: (NOTE) This assay is not validated for testing neonatal or myeloproliferative syndrome specimens for Vitamin B12 levels. Performed at [facility]   Folate     Status: None   Collection Time: 10/04/14  7:15 PM  Result Value Ref Range   Folate 12.1 >5.9 ng/mL    Comment: Performed at [facility]  Iron and TIBC     Status: None   Collection Time: 10/04/14  7:15 PM  Result Value Ref Range   Iron 51 28 - 170 ug/dL   TIBC 344 250 - 450 ug/dL   Saturation Ratios 15 10.4 - 31.8 %   UIBC 293 ug/dL    Comment: Performed at [facility]  Ferritin     Status: None   Collection Time: 10/04/14  7:15 PM  Result Value Ref Range   Ferritin 22 11 - 307 ng/mL  Comment: Performed at [facility]  Reticulocytes     Status: Abnormal   Collection Time: 10/04/14  7:15 PM  Result Value Ref Range   Retic Ct Pct 1.5 0.4 - 3.1 %   RBC. 3.25 (L) 3.87 - 5.11 MIL/uL   Retic Count, Manual 48.8 19.0 - 186.0 K/uL    Comment: Performed at [facility]    Physical Findings: AIMS: Facial and Oral Movements Muscles of Facial Expression: None, normal Lips and Perioral Area: None, normal Jaw: None, normal Tongue: None, normal,Extremity Movements Upper (arms, wrists, hands, fingers): None, normal Lower (legs, knees, ankles, toes): None, normal, Trunk Movements Neck, shoulders, hips: None, normal, Overall Severity Severity of abnormal movements (highest score from questions above): None, normal Incapacitation due to abnormal movements: None, normal Patient's awareness of abnormal  movements (rate only patient's report): No Awareness, Dental Status Current problems with teeth and/or dentures?: No Does patient usually wear dentures?: No  CIWA:  CIWA-Ar Total: 0 COWS:  COWS Total Score: 0   See Psychiatric Specialty Exam and Suicide Risk Assessment completed by Attending Physician prior to discharge.  Discharge destination:  Patient is discharging to [facility] in [place], [place].   Is patient on multiple antipsychotic therapies at discharge:  No   Has Patient had three or more failed trials of antipsychotic monotherapy by history:  No    Recommended Plan for Multiple Antipsychotic Therapies: NA     Medication List    STOP taking these medications        cephALEXin 500 MG capsule  Commonly known as:  KEFLEX     ibuprofen 800 MG tablet  Commonly known as:  ADVIL,MOTRIN     ondansetron 4 MG tablet  Commonly known as:  ZOFRAN     oxyCODONE-acetaminophen 5-325 MG per tablet  Commonly known as:  PERCOCET/ROXICET      TAKE these medications      Indication   divalproex 500 MG 24 hr tablet  Commonly known as:  DEPAKOTE ER  Take 1 tablet (500 mg total) by mouth every morning. For mood control.   Indication:  Rapidly Alternating Manic-Depressive Psychosis     divalproex 250 MG 24 hr tablet  Commonly known as:  DEPAKOTE ER  Take 3 tablets (750 mg total) by mouth at bedtime. For improved stability of mood.   Indication:  Rapidly Alternating Manic-Depressive Psychosis     gabapentin 300 MG capsule  Commonly known as:  NEURONTIN  Take 2 capsules (600 mg total) by mouth 3 (three) times daily.   Indication:  Agitation, Neuropathic Pain     lidocaine 5 %  Commonly known as:  LIDODERM  Place 1 patch onto the skin daily. Remove & Discard patch within 12 hours or as directed by MD   Indication:  Chronic pain     nicotine polacrilex 2 MG gum  Commonly known as:  NICORETTE  Take 1 each (2 mg total) by mouth as needed for smoking cessation. May  purchase over the counter for smoking cessation.   Indication:  Nicotine Addiction     prazosin 1 MG capsule  Commonly known as:  MINIPRESS  Take 1 capsule (1 mg total) by mouth at bedtime. For nightmares.   Indication:  PTSD symptoms     thiamine 100 MG tablet  Take 1 tablet (100 mg total) by mouth daily.   Indication:  Deficiency in Thiamine or Vitamin B1     traZODone 50 MG tablet  Commonly known as:  DESYREL  Take 1 tablet (50 mg total) by mouth at bedtime and may repeat dose one time if needed.   Indication:  Trouble Sleeping           Follow-up Information    Follow up with [facility] On 10/10/2014.   Why:  Appt at 9 AM for intake assessment.  If you feel you need to be seen sooner, you may present to the walk in clinic on Monday June 6 between 9 AM and 4 PM   Contact information:   [place]  [address] Phone:  [phone] Fax:  [phone]      Follow up with [facility]    . Call today.   Why:  As needed for any medical problems    Contact information:   [address] [place] [address] [phone]      Follow-up recommendations:   Activity: as tolerated Diet: Regular Tests: NA Other: See below  Comments:   Take all your medications as prescribed by your mental healthcare provider.  Report any adverse effects and or reactions from your medicines to your outpatient provider promptly.  Patient is instructed and cautioned to not engage in alcohol and or illegal drug use while on prescription medicines.  In the event of worsening symptoms, patient is instructed to call the crisis hotline, 911 and or go to the nearest ED for appropriate evaluation and treatment of symptoms.  Follow-up with your primary care provider for your other medical issues, concerns and or health care needs.   Total Discharge Time: Greater than 30 minutes  Signed: DAVIS, LAURA NP-C 10/05/2014, 10:40  AM  Patient seen, Suicide Assessment Completed.  Disposition Plan Reviewed

## 2016-10-09 IMAGING — CT CT RENAL STONE PROTOCOL
3 of 4 series · 9 of 46 positions shown, 16 images · non-contrast
Comparison: 12/18/2013.

CLINICAL DATA: 28-year-old female with right-sided abdominal and
flank pain since [DATE]. History of stones post lithotripsy. Initial
encounter.

EXAM:
CT ABDOMEN AND PELVIS WITHOUT CONTRAST
TECHNIQUE: Multidetector CT imaging of the abdomen and pelvis was performed
following the standard protocol without IV contrast.

[Series 3: lung 5.0 b60f · axial · 0.66mm/px · z∈[-132,-58]mm · 5 of 23 slices shown, 10 images]
[im 4/23  soft-tissue]
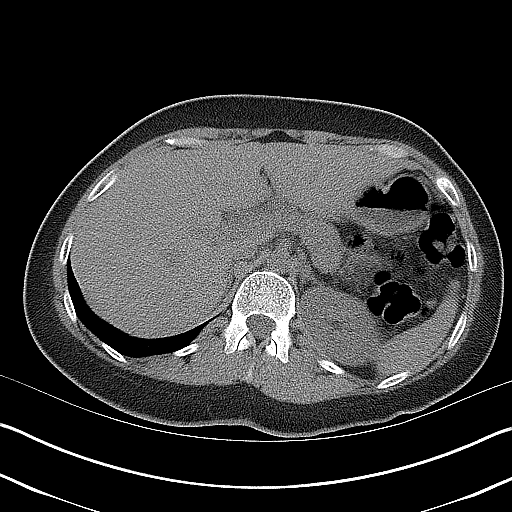
[im 4/23  bone]
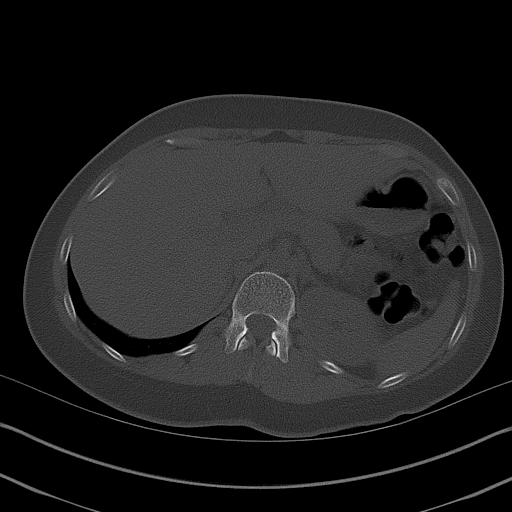
[im 8/23  soft-tissue]
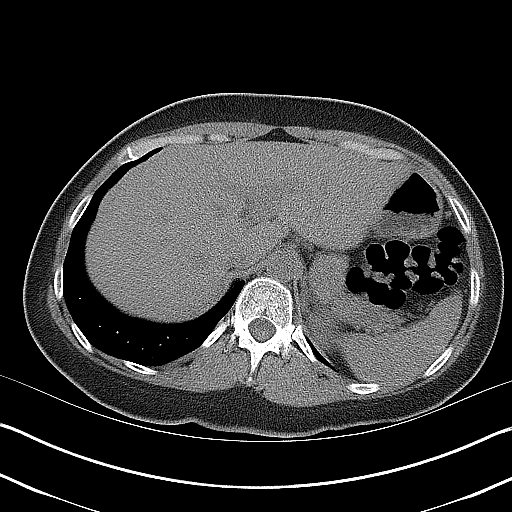
[im 8/23  lung]
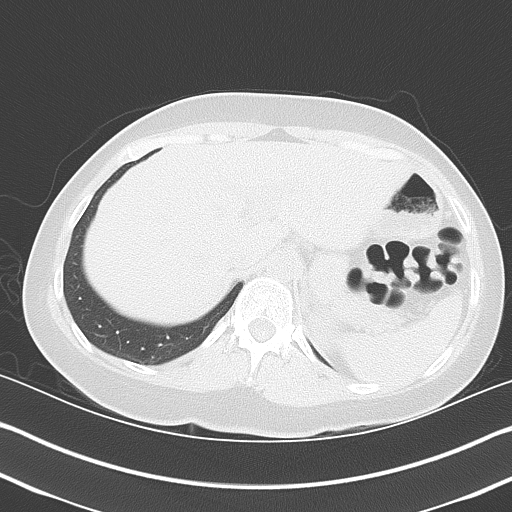
[im 12/23  soft-tissue]
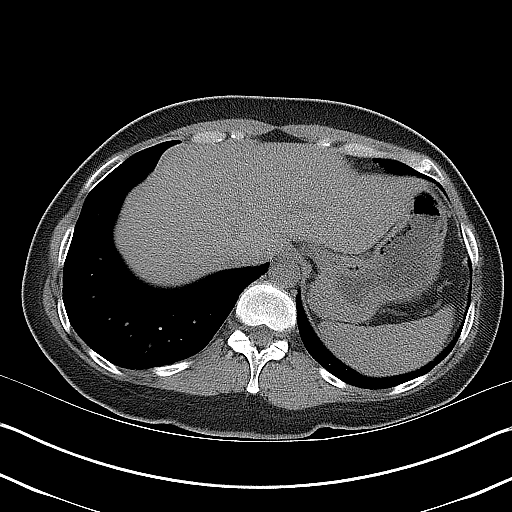
[im 12/23  lung]
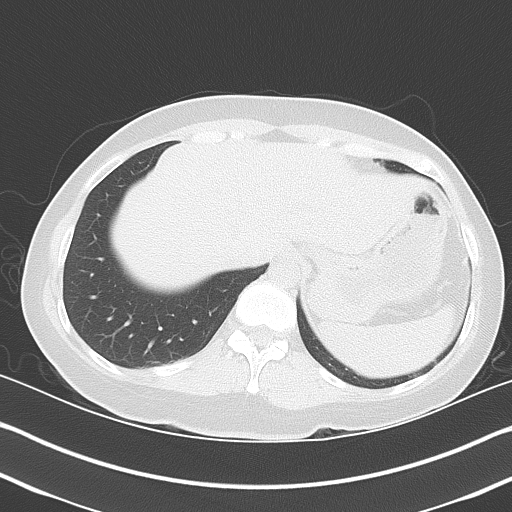
[im 15/23  soft-tissue]
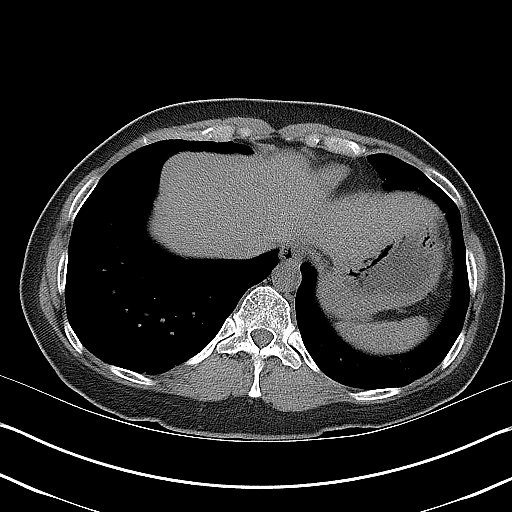
[im 15/23  lung]
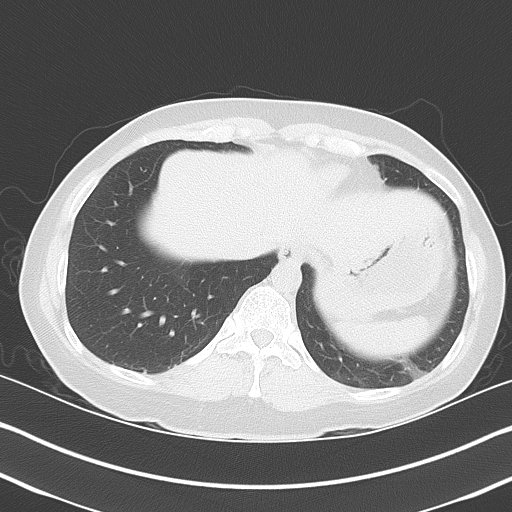
[im 19/23  soft-tissue]
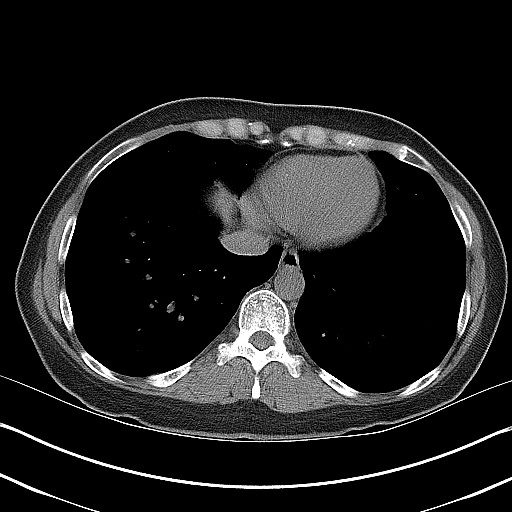
[im 19/23  lung]
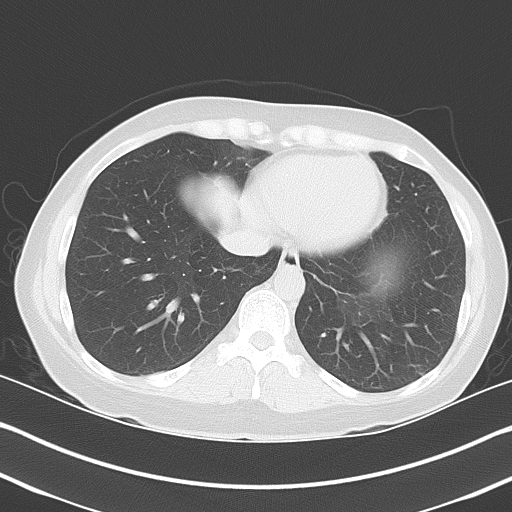

[Series 4: mpr coronal (id) · coronal · 0.66mm/px · 3 of 69 slices shown, 4 images]
[im 23/69  soft-tissue]
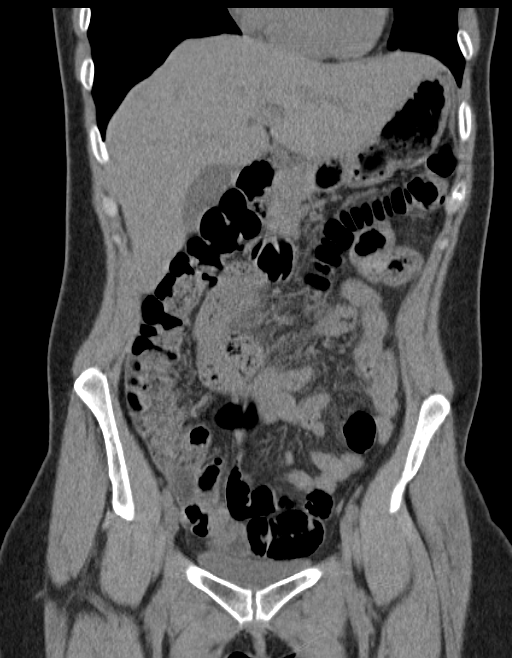
[im 31/69  soft-tissue]
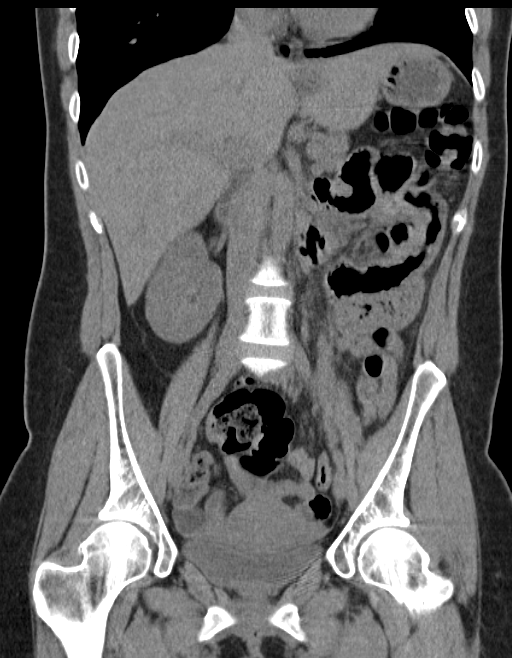
[im 31/69  bone]
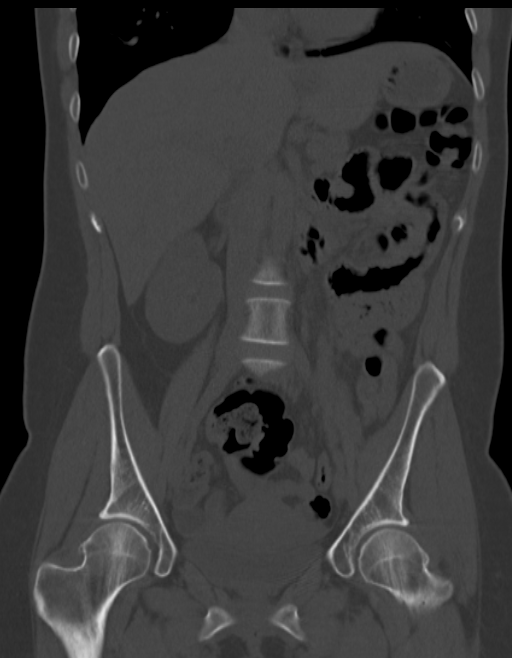
[im 38/69  soft-tissue]
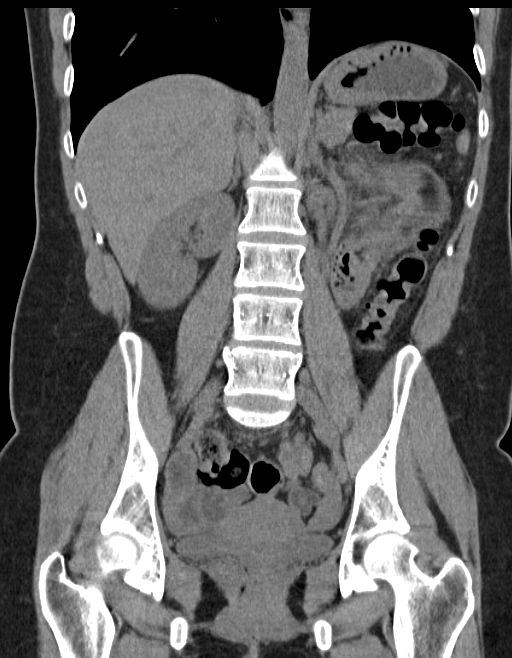

[Series 5: mpr sagittal (id) · sagittal · 0.48mm/px · 1 of 111 slices shown, 2 images]
[im 37/111  soft-tissue]
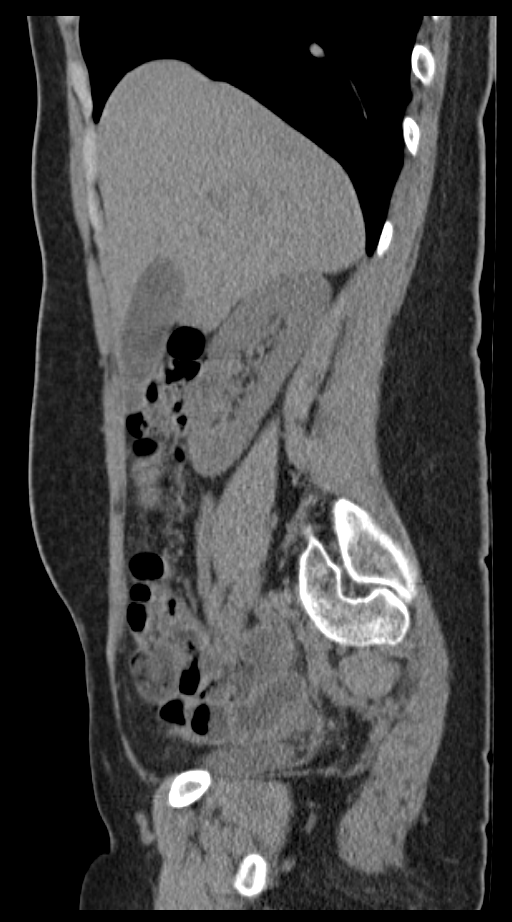
[im 37/111  bone]
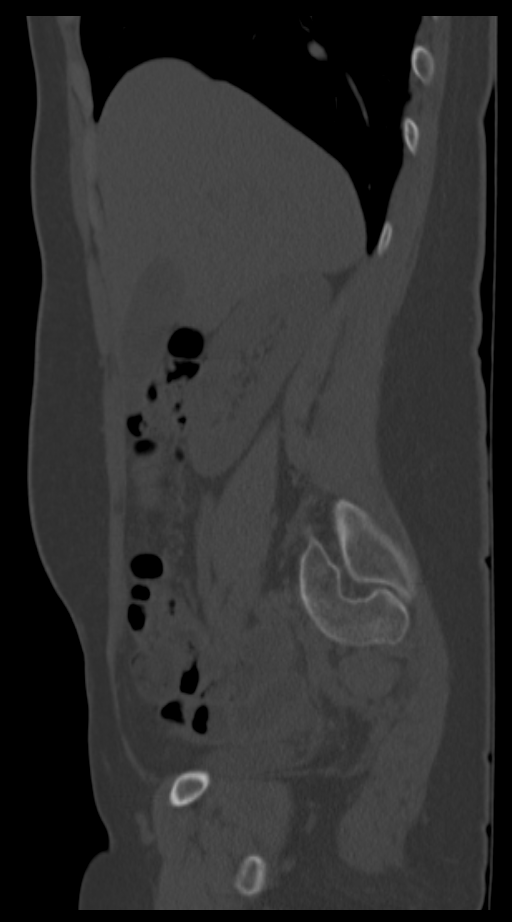

[9 of 46 positions shown; findings below may reference images not displayed]

FINDINGS: No evidence of obstructing renal or ureteral calculi. There are tiny
nonobstructing bilateral renal stones.

No extra luminal bowel inflammatory process, free fluid or free air.
Specifically, no inflammation is noted surrounding the appendix or
terminal ileum.

Mild fatty infiltration of the liver. Taking into account limitation
by non contrast imaging, no worrisome hepatic, splenic, pancreatic,
renal or adrenal lesion. No calcified gallstones.

Previously noted left adnexal mass is not appreciated on current
exam. Adjacent fluid-filled bowel slightly limits evaluation.

No osseous abnormality noted.  No adenopathy.
IMPRESSION: No evidence of obstructing renal or ureteral calculi. There are tiny
nonobstructing bilateral renal stones.

No extra luminal bowel inflammatory process, free fluid or free air.
Specifically, no inflammation is noted surrounding the appendix or
terminal ileum.

Mild fatty infiltration of the liver.

Previously noted left adnexal mass is not appreciated on current
exam. Adjacent fluid-filled bowel slightly limits evaluation.

## 2020-07-12 ENCOUNTER — Emergency Department
Admission: EM | Admit: 2020-07-12 | Discharge: 2020-07-12 | Disposition: A | Discharge status: Home / Self Care | Payer: BLUE CROSS/BLUE SHIELD | Attending: Emergency Medicine | Admitting: Emergency Medicine

## 2020-07-12 ENCOUNTER — Other Ambulatory Visit: Payer: Self-pay

## 2020-07-12 DIAGNOSIS — N730 Acute parametritis and pelvic cellulitis: Secondary | ICD-10-CM

## 2020-07-12 DIAGNOSIS — R1030 Lower abdominal pain, unspecified: Secondary | ICD-10-CM | POA: Diagnosis present

## 2020-07-12 DIAGNOSIS — J45909 Unspecified asthma, uncomplicated: Secondary | ICD-10-CM | POA: Insufficient documentation

## 2020-07-12 DIAGNOSIS — N3090 Cystitis, unspecified without hematuria: Secondary | ICD-10-CM | POA: Diagnosis not present

## 2020-07-12 DIAGNOSIS — F17210 Nicotine dependence, cigarettes, uncomplicated: Secondary | ICD-10-CM | POA: Insufficient documentation

## 2020-07-12 DIAGNOSIS — R1032 Left lower quadrant pain: Secondary | ICD-10-CM

## 2020-07-12 DIAGNOSIS — N739 Female pelvic inflammatory disease, unspecified: Secondary | ICD-10-CM | POA: Diagnosis not present

## 2020-07-12 DIAGNOSIS — Z91040 Latex allergy status: Secondary | ICD-10-CM | POA: Diagnosis not present

## 2020-07-12 HISTORY — DX: Post-traumatic stress disorder, unspecified: F43.10

## 2020-07-12 LAB — CHLAMYDIA/NGC RT PCR (ARMC ONLY)
Chlamydia Tr: NOT DETECTED
N gonorrhoeae: NOT DETECTED

## 2020-07-12 LAB — LIPASE, BLOOD: Lipase: 27 U/L (ref 11–51)

## 2020-07-12 LAB — URINALYSIS, COMPLETE (UACMP) WITH MICROSCOPIC
Bacteria, UA: NONE SEEN
Bilirubin Urine: NEGATIVE
Glucose, UA: NEGATIVE mg/dL
Ketones, ur: NEGATIVE mg/dL
Nitrite: NEGATIVE
Protein, ur: 30 mg/dL — AB
Specific Gravity, Urine: 1.014 (ref 1.005–1.030)
pH: 7 (ref 5.0–8.0)

## 2020-07-12 LAB — CBC
HCT: 40.2 % (ref 36.0–46.0)
Hemoglobin: 13.7 g/dL (ref 12.0–15.0)
MCH: 33.3 pg (ref 26.0–34.0)
MCHC: 34.1 g/dL (ref 30.0–36.0)
MCV: 97.8 fL (ref 80.0–100.0)
Platelets: 324 10*3/uL (ref 150–400)
RBC: 4.11 MIL/uL (ref 3.87–5.11)
RDW: 12.7 % (ref 11.5–15.5)
WBC: 11.5 10*3/uL — ABNORMAL HIGH (ref 4.0–10.5)
nRBC: 0 % (ref 0.0–0.2)

## 2020-07-12 LAB — COMPREHENSIVE METABOLIC PANEL
ALT: 13 U/L (ref 0–44)
AST: 14 U/L — ABNORMAL LOW (ref 15–41)
Albumin: 4.2 g/dL (ref 3.5–5.0)
Alkaline Phosphatase: 73 U/L (ref 38–126)
Anion gap: 8 (ref 5–15)
BUN: 11 mg/dL (ref 6–20)
CO2: 22 mmol/L (ref 22–32)
Calcium: 9.2 mg/dL (ref 8.9–10.3)
Chloride: 104 mmol/L (ref 98–111)
Creatinine, Ser: 1.06 mg/dL — ABNORMAL HIGH (ref 0.44–1.00)
GFR, Estimated: >60 mL/min (ref >60)
Glucose, Bld: 90 mg/dL (ref 70–99)
Potassium: 4.1 mmol/L (ref 3.5–5.1)
Sodium: 134 mmol/L — ABNORMAL LOW (ref 135–145)
Total Bilirubin: 0.7 mg/dL (ref 0.3–1.2)
Total Protein: 7.9 g/dL (ref 6.5–8.1)

## 2020-07-12 LAB — WET PREP, GENITAL
Sperm: NONE SEEN
Trich, Wet Prep: NONE SEEN
Yeast Wet Prep HPF POC: NONE SEEN

## 2020-07-12 LAB — POC URINE PREG, ED: Preg Test, Ur: NEGATIVE

## 2020-07-12 MED ORDER — IBUPROFEN 600 MG PO TABS: 600.0000 mg | ORAL_TABLET | Freq: Three times a day (TID) | ORAL | 0 refills | Status: DC | PRN

## 2020-07-12 MED ORDER — METRONIDAZOLE 500 MG PO TABS: 500.0000 mg | ORAL_TABLET | Freq: Two times a day (BID) | ORAL | 0 refills | Status: AC

## 2020-07-12 MED ORDER — OXYCODONE-ACETAMINOPHEN 5-325 MG PO TABS
2.0000 | ORAL_TABLET | Freq: Once | ORAL | Status: AC
  Administered 2020-07-12: 2 via ORAL
  Filled 2020-07-12: qty 2

## 2020-07-12 MED ORDER — CEFTRIAXONE SODIUM 250 MG IJ SOLR
250.0000 mg | Freq: Once | INTRAMUSCULAR | Status: AC
  Administered 2020-07-12: 250 mg via INTRAMUSCULAR
  Filled 2020-07-12: qty 250

## 2020-07-12 MED ORDER — DOXYCYCLINE HYCLATE 100 MG PO TABS
100.0000 mg | ORAL_TABLET | Freq: Once | ORAL | Status: AC
Start: 2020-07-12 — End: 2020-07-12
  Administered 2020-07-12: 100 mg via ORAL
  Filled 2020-07-12: qty 1

## 2020-07-12 MED ORDER — DOXYCYCLINE HYCLATE 100 MG PO CAPS: 100.0000 mg | ORAL_CAPSULE | Freq: Two times a day (BID) | ORAL | 0 refills | Status: AC

## 2020-07-12 NOTE — ED Triage Notes (Signed)
Pt to ER via POV with complaints of LLQ pain x1 week. Reports taking midol at home with no relief. States pain is sharp and stabbing. Denies urinary symptoms other than increased frequency. No NVD. Last BM 2 days ago.

## 2020-07-12 NOTE — ED Notes (Signed)
See triage note  Presents with LLQ pain   States pain started several days ago  No fever  Positive nausea  No vomiting  States she did have some min relief with OTC meds at first

## 2020-07-12 NOTE — ED Provider Notes (Signed)
[facility] Emergency Department Provider Note  ____________________________________________   Event Date/Time   First MD Initiated Contact with Patient 07/12/20 1231     (approximate)  I have reviewed the triage vital signs and the nursing notes.   HISTORY  Chief Complaint Abdominal Pain    HPI Dominique Bennett is a 35 y.o. female with history of bipolar disorder, prior opioid use disorder, here with lower abdominal pain.  The patient states that for the last 2 to 3 days, she has had sharp, stabbing, suprapubic and left lower quadrant abdominal pain.  The patient has had some associated urinary frequency but not overt dysuria.  No hematuria.  No flank pain.  She denies overt vaginal discharge but has had some dyspareunia.  She has a history of STDs including Trichomonas.  No known fevers.  No flank pain.  She has history of kidney stones but states the pain does not feel similar to this.  She is sexually active and does not use protection.  No other complaints.        Past Medical History:  Diagnosis Date  . Anxiety   . Asthma   . Bipolar 1 disorder (HCC)   . Chronic back pain   . Depression   . Migraines   . PTSD (post-traumatic stress disorder)   . Sleep disorder     Patient Active Problem List   Diagnosis Date Noted  . Bipolar affective disorder (HCC)   . Hematuria   . PTSD (post-traumatic stress disorder) 10/01/2014  . Opioid use disorder, moderate, dependence (HCC) 10/01/2014  . 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 Bipolar disorder (HCC) 10/01/2014    Past Surgical History:  Procedure Laterality Date  . CESAREAN SECTION    . LITHOTRIPSY    . TONSILLECTOMY      Prior to Admission medications   Medication Sig Start Date End Date Taking? Authorizing Provider  buPROPion (WELLBUTRIN) 75 MG tablet Take 75 mg by mouth 2 (two) times daily.   Yes [provider]  busPIRone (BUSPAR) 10 MG  tablet Take 10 mg by mouth 3 (three) times daily.   Yes [provider]  doxycycline (VIBRAMYCIN) 100 MG capsule Take 1 capsule (100 mg total) by mouth 2 (two) times daily for 14 days. 07/12/20 07/26/20 Yes Shaune PollackIsaacs, Cameron, MD  ibuprofen (ADVIL) 600 MG tablet Take 1 tablet (600 mg total) by mouth every 8 (eight) hours as needed for moderate pain. 07/12/20  Yes Shaune PollackIsaacs, Cameron, MD  metroNIDAZOLE (FLAGYL) 500 MG tablet Take 1 tablet (500 mg total) by mouth 2 (two) times daily for 7 days. 07/12/20 07/19/20 Yes Shaune PollackIsaacs, Cameron, MD  QUEtiapine (SEROQUEL) 100 MG tablet Take 150 mg by mouth in the morning. 450 mg at bedtime   Yes [provider]  divalproex (DEPAKOTE ER) 250 MG 24 hr tablet Take 3 tablets (750 mg total) by mouth at bedtime. For improved stability of mood. 10/05/14   Thermon Leylandavis, Laura A, NP  traZODone (DESYREL) 50 MG tablet Take 1 tablet (50 mg total) by mouth at bedtime and may repeat dose one time if needed. Patient taking differently: Take 100 mg by mouth at bedtime and may repeat dose one time if needed. 10/05/14   Thermon Leylandavis, Laura A, NP    Allergies Tramadol, Cymbalta [duloxetine hcl], Other, Latex, and Nickel  No family history on file.  Social History Social History   Tobacco Use  . Smoking status: Current Every Day Smoker  Packs/day: 1.00    Types: Cigarettes  . Smokeless tobacco: Never Used  Substance Use Topics  . Alcohol use: No  . Drug use: Not Currently    Review of Systems  Review of Systems  Constitutional: Negative for chills and fever.  HENT: Negative for sore throat.   Respiratory: Negative for shortness of breath.   Cardiovascular: Negative for chest pain.  Gastrointestinal: Negative for abdominal pain.  Genitourinary: Positive for frequency and pelvic pain. Negative for flank pain.  Musculoskeletal: Negative for neck pain.  Skin: Negative for rash and wound.  Allergic/Immunologic: Negative for immunocompromised state.  Neurological: Negative for  weakness and numbness.  Hematological: Does not bruise/bleed easily.     ____________________________________________  PHYSICAL EXAM:      VITAL SIGNS: ED Triage Vitals  Enc Vitals Group     BP 07/12/20 1142 128/82     Pulse Rate 07/12/20 1142 82     Resp 07/12/20 1142 18     Temp 07/12/20 1142 98.2 F (36.8 C)     Temp Source 07/12/20 1142 Oral     SpO2 07/12/20 1142 98 %     Weight 07/12/20 1231 154 lb 5.2 oz (70 kg)     Height 07/12/20 1142 5\' 5"  (1.651 m)     Head Circumference --      Peak Flow --      Pain Score 07/12/20 1142 9     Pain Loc --      Pain Edu? --      Excl. in GC? --      Physical Exam Vitals and nursing note reviewed.  Constitutional:      General: She is not in acute distress.    Appearance: She is well-developed.  HENT:     Head: Normocephalic and atraumatic.  Eyes:     Conjunctiva/sclera: Conjunctivae normal.  Cardiovascular:     Rate and Rhythm: Normal rate and regular rhythm.     Heart sounds: Normal heart sounds. No murmur heard. No friction rub.  Pulmonary:     Effort: Pulmonary effort is normal. No respiratory distress.     Breath sounds: Normal breath sounds. No wheezing or rales.  Abdominal:     General: There is no distension.     Palpations: Abdomen is soft.     Tenderness: There is abdominal tenderness in the suprapubic area and left lower quadrant. There is no guarding or rebound.  Musculoskeletal:     Cervical back: Neck supple.  Skin:    General: Skin is warm.     Capillary Refill: Capillary refill takes less than 2 seconds.  Neurological:     Mental Status: She is alert and oriented to person, place, and time.     Motor: No abnormal muscle tone.       ____________________________________________   LABS (all labs ordered are listed, but only abnormal results are displayed)  Labs Reviewed  WET PREP, GENITAL - Abnormal; Notable for the following components:      Result Value   Clue Cells Wet Prep HPF POC PRESENT  (*)    WBC, Wet Prep HPF POC FEW (*)    All other components within normal limits  COMPREHENSIVE METABOLIC PANEL - Abnormal; Notable for the following components:   Sodium 134 (*)    Creatinine, Ser 1.06 (*)    AST 14 (*)    All other components within normal limits  CBC - Abnormal; Notable for the following components:   WBC 11.5 (*)  All other components within normal limits  URINALYSIS, COMPLETE (UACMP) WITH MICROSCOPIC - Abnormal; Notable for the following components:   Color, Urine YELLOW (*)    APPearance CLEAR (*)    Hgb urine dipstick MODERATE (*)    Protein, ur 30 (*)    Leukocytes,Ua TRACE (*)    Non Squamous Epithelial PRESENT (*)    All other components within normal limits  CHLAMYDIA/NGC RT PCR ([facility] ONLY)  LIPASE, BLOOD  POC URINE PREG, ED    ____________________________________________  EKG:  ________________________________________  RADIOLOGY All imaging, including plain films, CT scans, and ultrasounds, independently reviewed by me, and interpretations confirmed via formal radiology reads.  ED MD interpretation:     Official radiology report(s): No results found.  ____________________________________________  PROCEDURES   Procedure(s) performed (including Critical Care):  Procedures  ____________________________________________  INITIAL IMPRESSION / MDM / ASSESSMENT AND PLAN / ED COURSE  As part of my medical decision making, I reviewed the following data within the electronic MEDICAL RECORD NUMBER Nursing notes reviewed and incorporated, Old chart reviewed, Notes from prior ED visits, and Dominique Bennett       *Dominique Bennett was evaluated in Emergency Department on 07/12/2020 for the symptoms described in the history of present illness. She was evaluated in the context of the global COVID-19 pandemic, which necessitated consideration that the patient might be at risk for infection with the SARS-CoV-2 virus that causes  COVID-19. Institutional protocols and algorithms that pertain to the evaluation of patients at risk for COVID-19 are in a state of rapid change based on information released by regulatory bodies including the CDC and federal and state organizations. These policies and algorithms were followed during the patient's care in the ED.  Some ED evaluations and interventions may be delayed as a result of limited staffing during the pandemic.*     Medical Decision Making: 35 year old well-appearing female here with left lower quadrant and suprapubic abdominal pain.  Lab work shows moderate pyuria consistent with UTI although clinically his symptoms are more consistent with possible PID.  Pelvic exam shows moderate CMT and vaginal discharge.  She is positive for clue cells and white cells, but negative trichomonas and yeast.  Pregnancy test is negative.  She has minimal leukocytosis but no signs of sepsis clinically.  She is afebrile.  No flank pain, vomiting, or signs of pyelonephritis.  She was given Rocephin and will treat with Doxy and Flagyl as an outpatient.  Urine culture sent.  Return precautions given.  No focal abdominal tenderness to suggest diverticulitis, appendicitis, or alternative intra-abdominal pathology.  ____________________________________________  FINAL CLINICAL IMPRESSION(S) / ED DIAGNOSES  Final diagnoses:  Left lower quadrant abdominal pain  PID (acute pelvic inflammatory disease)  Cystitis     MEDICATIONS GIVEN DURING THIS VISIT:  Medications  oxyCODONE-acetaminophen (PERCOCET/ROXICET) 5-325 MG per tablet 2 tablet (2 tablets Oral Given 07/12/20 1310)  cefTRIAXone (ROCEPHIN) injection 250 mg (250 mg Intramuscular Given 07/12/20 1344)  doxycycline (VIBRA-TABS) tablet 100 mg (100 mg Oral Given 07/12/20 1344)     ED Discharge Orders         Ordered    doxycycline (VIBRAMYCIN) 100 MG capsule  2 times daily        07/12/20 1350    metroNIDAZOLE (FLAGYL) 500 MG tablet  2 times  daily        07/12/20 1350    ibuprofen (ADVIL) 600 MG tablet  Every 8 hours PRN        07/12/20 1350  Note:  This document was prepared using Dragon voice recognition software and may include unintentional dictation errors.   Shaune Pollack, MD 07/12/20 1355

## 2020-07-31 ENCOUNTER — Other Ambulatory Visit: Payer: Self-pay

## 2020-07-31 ENCOUNTER — Other Ambulatory Visit: Payer: Self-pay | Admitting: Gerontology

## 2020-07-31 ENCOUNTER — Encounter: Payer: Self-pay | Admitting: Gerontology

## 2020-07-31 ENCOUNTER — Ambulatory Visit: Payer: Medicaid Other | Admitting: Gerontology

## 2020-07-31 VITALS — BP 105/71 | HR 79 | Temp 97.2°F | Resp 16 | Wt 186.7 lb

## 2020-07-31 DIAGNOSIS — R202 Paresthesia of skin: Secondary | ICD-10-CM

## 2020-07-31 DIAGNOSIS — F4310 Post-traumatic stress disorder, unspecified: Secondary | ICD-10-CM

## 2020-07-31 DIAGNOSIS — Z8719 Personal history of other diseases of the digestive system: Secondary | ICD-10-CM

## 2020-07-31 DIAGNOSIS — K5909 Other constipation: Secondary | ICD-10-CM

## 2020-07-31 DIAGNOSIS — Z7689 Persons encountering health services in other specified circumstances: Secondary | ICD-10-CM

## 2020-07-31 DIAGNOSIS — G8929 Other chronic pain: Secondary | ICD-10-CM

## 2020-07-31 DIAGNOSIS — M549 Dorsalgia, unspecified: Secondary | ICD-10-CM | POA: Insufficient documentation

## 2020-07-31 DIAGNOSIS — F3160 Bipolar disorder, current episode mixed, unspecified: Secondary | ICD-10-CM

## 2020-07-31 DIAGNOSIS — R200 Anesthesia of skin: Secondary | ICD-10-CM | POA: Insufficient documentation

## 2020-07-31 MED ORDER — GABAPENTIN 400 MG PO CAPS
400.0000 mg | ORAL_CAPSULE | Freq: Three times a day (TID) | ORAL | 0 refills | Status: DC
Start: 2020-07-31 — End: 2020-07-31

## 2020-07-31 MED ORDER — BUSPIRONE HCL 15 MG PO TABS: 15.0000 mg | ORAL_TABLET | Freq: Three times a day (TID) | ORAL | 0 refills | Status: DC

## 2020-07-31 MED ORDER — QUETIAPINE FUMARATE 100 MG PO TABS: 150.0000 mg | ORAL_TABLET | Freq: Every morning | ORAL | 0 refills | Status: DC

## 2020-07-31 MED ORDER — DOCUSATE SODIUM 100 MG PO CAPS
100.0000 mg | ORAL_CAPSULE | Freq: Two times a day (BID) | ORAL | 0 refills | Status: AC
Start: 2020-07-31 — End: ?

## 2020-07-31 MED ORDER — TRAZODONE HCL 100 MG PO TABS: 100.0000 mg | ORAL_TABLET | Freq: Every evening | ORAL | 0 refills | Status: DC | PRN

## 2020-07-31 MED ORDER — FLUOXETINE HCL 40 MG PO CAPS: 40.0000 mg | ORAL_CAPSULE | Freq: Every day | ORAL | 0 refills | Status: DC

## 2020-07-31 NOTE — Progress Notes (Signed)
New Patient Office Visit  Subjective:  Patient ID: Dominique Bennett, female    DOB: February 15, 1986  Age: 35 y.o. MRN: [id number]  CC: No chief complaint on file.   HPI Dominique Bennett presents to establish care and evaluation of her chronic condition. She has a history of Bipolar, anxiety and depression. She takes Fluoxetine 40 mg daily, Quetiapine 150 mg in the morning and 450 mg at bedtime, Buspirone 15 mg tid, and Trazodone 100 mg at bedtime as needed. She states that medications were prescribed by [facility] in [place]. She currently resides at [facility] for rehabilitation. She denies suicidal nor homicidal ideation. She c/o numbness and tingling that radiates from bilateral hands to her fore arm. She states that her hands hurt when she sleeps and shaking her hand minimally relieves symptoms. She also c/o chronic back pain that has been going on for many years. She describes pain as sharp stabing pain that radiates through her spine. She reports that standing aggravates pain, gabapentin minimally relieves symptoms. She also has a history of IBS and takes Linzess in the past and she reports moving her bowe once a week. She reports having Colonoscopy in her teenage years but no documentation about colonoscopy was found. She states that her stool is usually hard, denies hematochezia. She states that her last bowel movement was 2 days ago. She denies abdominal pain, nausea and constipation. Overall, she states that she's doing well and offers no further complaint.  Past Medical History:  Diagnosis Date  . Anxiety   . Asthma   . Bipolar 1 disorder ([place])   . Chronic back pain   . Depression   . Migraines   . PTSD (post-traumatic stress disorder)   . Sleep disorder     Past Surgical History:  Procedure Laterality Date  . CESAREAN SECTION    . LITHOTRIPSY    . TONSILLECTOMY      No family history on file.  Social History   Socioeconomic History  . Marital status: Legally Separated     Spouse name: Not on file  . Number of children: Not on file  . Years of education: Not on file  . Highest education level: Not on file  Occupational History  . Not on file  Tobacco Use  . Smoking status: Current Every Day Smoker    Packs/day: 1.00    Types: Cigarettes  . Smokeless tobacco: Never Used  Substance and Sexual Activity  . Alcohol use: No  . Drug use: Not Currently  . Sexual activity: Not on file  Other Topics Concern  . Not on file  Social History Narrative  . Not on file   Social Determinants of Health   Financial Resource Strain: Not on file  Food Insecurity: Not on file  Transportation Needs: Not on file  Physical Activity: Not on file  Stress: Not on file  Social Connections: Not on file  Intimate Partner Violence: Not on file    ROS Review of Systems  Constitutional: Negative.   HENT: Negative.   Eyes: Negative.   Respiratory: Negative.   Cardiovascular: Negative.   Gastrointestinal: Positive for constipation.  Endocrine: Negative.   Musculoskeletal: Positive for back pain (chronic back pain). Negative for arthralgias.  Skin: Negative.   Neurological: Positive for numbness (bilateral hands).  Psychiatric/Behavioral: The patient is nervous/anxious.     Objective:   Today's Vitals: BP 105/71 (BP Location: Right Arm, Patient Position: Sitting, Cuff Size: Large)   Pulse 79   Temp Marland Kitchen)  97.2 F (36.2 C)   Resp 16   Wt 186 lb 11.2 oz (84.7 kg)   SpO2 98%   BMI 31.07 kg/m   Physical Exam Constitutional:      Appearance: Normal appearance.  HENT:     Head: Normocephalic and atraumatic.     Nose: Nose normal.     Mouth/Throat:     Mouth: Mucous membranes are moist.  Eyes:     Extraocular Movements: Extraocular movements intact.     Conjunctiva/sclera: Conjunctivae normal.     Pupils: Pupils are equal, round, and reactive to light.  Cardiovascular:     Rate and Rhythm: Normal rate and regular rhythm.     Pulses: Normal pulses.      Heart sounds: Normal heart sounds.  Pulmonary:     Effort: Pulmonary effort is normal.     Breath sounds: Normal breath sounds.  Abdominal:     General: Abdomen is flat. Bowel sounds are normal.     Palpations: Abdomen is soft.  Genitourinary:    Comments: Deferred per patient. Musculoskeletal:        General: Normal range of motion.     Cervical back: Normal range of motion.  Skin:    General: Skin is warm and dry.  Neurological:     General: No focal deficit present.     Mental Status: She is alert and oriented to person, place, and time. Mental status is at baseline.  Psychiatric:        Mood and Affect: Mood normal.        Behavior: Behavior normal.        Thought Content: Thought content normal.        Judgment: Judgment normal.     Assessment & Plan:   1. Encounter to establish care -Routine labs will be checked - Lipid panel; Future - Comp Met (CMET); Future - Urinalysis; Future - HgB A1c; Future - HgB A1c - Urinalysis - Comp Met (CMET) - Lipid panel  2. Bipolar affective disorder, current episode mixed, current episode severity unspecified ([place]) - She will continue on medication, Will follow up with [facility] Behavioral health Ms Jerrilyn Cairo and she was encouraged to call and schedule an appointment with [place]. She is to call  The crisis help line for any concerns. - FLUoxetine (PROZAC) 40 MG capsule; Take 1 capsule (40 mg total) by mouth daily.  Dispense: 30 capsule; Refill: 0 - QUEtiapine (SEROQUEL) 100 MG tablet; Take 1.5 tablets (150 mg total) by mouth in the morning. 450 mg at bedtime  Dispense: 60 tablet; Refill: 0 - traZODone (DESYREL) 100 MG tablet; Take 1 tablet (100 mg total) by mouth at bedtime as needed for sleep.  Dispense: 30 tablet; Refill: 0 - busPIRone (BUSPAR) 15 MG tablet; Take 1 tablet (15 mg total) by mouth 3 (three) times daily.  Dispense: 90 tablet; Refill: 0  3. PTSD (post-traumatic stress disorder) - Same as #2 - FLUoxetine (PROZAC) 40 MG  capsule; Take 1 capsule (40 mg total) by mouth daily.  Dispense: 30 capsule; Refill: 0 - QUEtiapine (SEROQUEL) 100 MG tablet; Take 1.5 tablets (150 mg total) by mouth in the morning. 450 mg at bedtime  Dispense: 60 tablet; Refill: 0 - busPIRone (BUSPAR) 15 MG tablet; Take 1 tablet (15 mg total) by mouth 3 (three) times daily.  Dispense: 90 tablet; Refill: 0  4. Chronic back pain, unspecified back location, unspecified back pain laterality - She will continue on gabapentin and will follow up with Orthopedic  Surgeon Dr Vickki Hearing. - gabapentin (NEURONTIN) 400 MG capsule; Take 1 capsule (400 mg total) by mouth 3 (three) times daily.  Dispense: 90 capsule; Refill: 0  5. Numbness and tingling in both hands -Possible CarpelTunnel to wrist, was advised to wear hand brace. She was advised to notify clinic for worsening symptoms. - gabapentin (NEURONTIN) 400 MG capsule; Take 1 capsule (400 mg total) by mouth 3 (three) times daily.  Dispense: 90 capsule; Refill: 0  6. History of IBS with Chronic constipation - She will continue on Colace, advised to increase fiber and water intake. She's to complete [facility] financial application for possible Gastroenterology referral. - docusate sodium (COLACE) 100 MG capsule; Take 1 capsule (100 mg total) by mouth 2 (two) times daily.  Dispense: 60 capsule; Refill: 0     Follow-up: Return in about 15 days (around 08/15/2020), or if symptoms worsen or fail to improve.   Freddy Spadafora Jerold Coombe, NP

## 2020-07-31 NOTE — Progress Notes (Incomplete)
New Patient Office Visit  Subjective:  Patient ID: Dominique Bennett, female    DOB: 12/28/1985  Age: 35 y.o. MRN: [id number]  CC: No chief complaint on file.   HPI Dominique Bennett presents to establish care and evaluation of her chronic condition. She has a history of Bipolar, anxiety and depression. She takes Fluoxetine 40 mg daily, Quetiapine 150 mg in the morning and 450 mg at bedtime, Buspirone 15 mg tid, and Trazodone 100 mg at bedtime as needed. She states that medications were prescribed by [facility] in [place]. She currently resides at [facility] for rehabilitation. She denies suicidal nor homicidal ideation. She c/o numbness and tingling that radiates from bilateral hands to her fore arm. She states that her hands hurt when she sleeps and shaking her hand minimally relieves symptoms. She also c/o chronic back pain that has been going on for many years. She describes pain as sharp stabing pain that radiates through her spine. She reports that standing aggravates pain, gabapentin minimally relieves symptoms. She also has a history of IBS and takes Linzess in the past and she reports moving her bowe once a week. She reports having Colonoscopy in her teenage years but no documentation about colonoscopy was found. She states that her stool is usually hard, denies hematochezia. She states that her last bowel movement was 2 days ago. She denies abdominal pain, nausea and constipation. Overall, she states that she's doing well and offers no further complaint.  Past Medical History:  Diagnosis Date  . Anxiety   . Asthma   . Bipolar 1 disorder ([place])   . Chronic back pain   . Depression   . Migraines   . PTSD (post-traumatic stress disorder)   . Sleep disorder     Past Surgical History:  Procedure Laterality Date  . CESAREAN SECTION    . LITHOTRIPSY    . TONSILLECTOMY      No family history on file.  Social History   Socioeconomic History  . Marital status: Legally Separated     Spouse name: Not on file  . Number of children: Not on file  . Years of education: Not on file  . Highest education level: Not on file  Occupational History  . Not on file  Tobacco Use  . Smoking status: Current Every Day Smoker    Packs/day: 1.00    Types: Cigarettes  . Smokeless tobacco: Never Used  Substance and Sexual Activity  . Alcohol use: No  . Drug use: Not Currently  . Sexual activity: Not on file  Other Topics Concern  . Not on file  Social History Narrative  . Not on file   Social Determinants of Health   Financial Resource Strain: Not on file  Food Insecurity: Not on file  Transportation Needs: Not on file  Physical Activity: Not on file  Stress: Not on file  Social Connections: Not on file  Intimate Partner Violence: Not on file    ROS Review of Systems  Constitutional: Negative.   HENT: Negative.   Eyes: Negative.   Respiratory: Negative.   Cardiovascular: Negative.   Gastrointestinal: Negative.   Endocrine: Negative.   Musculoskeletal: Positive for back pain (chronic back pain). Negative for arthralgias.  Skin: Negative.   Neurological: Positive for numbness (bilateral hands).  Psychiatric/Behavioral: The patient is nervous/anxious.     Objective:   Today's Vitals: BP 105/71 (BP Location: Right Arm, Patient Position: Sitting, Cuff Size: Large)   Pulse 79   Temp (!) 97.2  F (36.2 C)   Resp 16   Wt 186 lb 11.2 oz (84.7 kg)   SpO2 98%   BMI 31.07 kg/m   Physical Exam Constitutional:      Appearance: Normal appearance.  HENT:     Head: Normocephalic and atraumatic.     Nose: Nose normal.     Mouth/Throat:     Mouth: Mucous membranes are moist.  Eyes:     Extraocular Movements: Extraocular movements intact.     Conjunctiva/sclera: Conjunctivae normal.     Pupils: Pupils are equal, round, and reactive to light.  Cardiovascular:     Rate and Rhythm: Normal rate and regular rhythm.     Pulses: Normal pulses.     Heart sounds: Normal  heart sounds.  Pulmonary:     Effort: Pulmonary effort is normal.     Breath sounds: Normal breath sounds.  Abdominal:     General: Abdomen is flat. Bowel sounds are normal.     Palpations: Abdomen is soft.  Genitourinary:    Comments: Deferred per patient. Musculoskeletal:        General: Normal range of motion.     Cervical back: Normal range of motion.  Skin:    General: Skin is warm and dry.  Neurological:     General: No focal deficit present.     Mental Status: She is alert and oriented to person, place, and time. Mental status is at baseline.  Psychiatric:        Mood and Affect: Mood normal.        Behavior: Behavior normal.        Thought Content: Thought content normal.        Judgment: Judgment normal.     Assessment & Plan:   1. Encounter to establish care -Routine labs will be checked - Lipid panel; Future - Comp Met (CMET); Future - Urinalysis; Future - HgB A1c; Future - HgB A1c - Urinalysis - Comp Met (CMET) - Lipid panel  2. Bipolar affective disorder, current episode mixed, current episode severity unspecified ([place]) - She will continue on medication, Will follow up with [facility] Behavioral health Ms Jerrilyn Cairo and she was encouraged to call and schedule an appointment with [place]. She is to call  The crisir - FLUoxetine (PROZAC) 40 MG capsule; Take 1 capsule (40 mg total) by mouth daily.  Dispense: 30 capsule; Refill: 0 - QUEtiapine (SEROQUEL) 100 MG tablet; Take 1.5 tablets (150 mg total) by mouth in the morning. 450 mg at bedtime  Dispense: 60 tablet; Refill: 0 - traZODone (DESYREL) 100 MG tablet; Take 1 tablet (100 mg total) by mouth at bedtime as needed for sleep.  Dispense: 30 tablet; Refill: 0 - busPIRone (BUSPAR) 15 MG tablet; Take 1 tablet (15 mg total) by mouth 3 (three) times daily.  Dispense: 90 tablet; Refill: 0  3. PTSD (post-traumatic stress disorder) *** - FLUoxetine (PROZAC) 40 MG capsule; Take 1 capsule (40 mg total) by mouth daily.   Dispense: 30 capsule; Refill: 0 - QUEtiapine (SEROQUEL) 100 MG tablet; Take 1.5 tablets (150 mg total) by mouth in the morning. 450 mg at bedtime  Dispense: 60 tablet; Refill: 0 - busPIRone (BUSPAR) 15 MG tablet; Take 1 tablet (15 mg total) by mouth 3 (three) times daily.  Dispense: 90 tablet; Refill: 0  4. Chronic back pain, unspecified back location, unspecified back pain laterality *** - gabapentin (NEURONTIN) 400 MG capsule; Take 1 capsule (400 mg total) by mouth 3 (three) times daily.  Dispense:  90 capsule; Refill: 0  5. Numbness and tingling in both hands *** - gabapentin (NEURONTIN) 400 MG capsule; Take 1 capsule (400 mg total) by mouth 3 (three) times daily.  Dispense: 90 capsule; Refill: 0  6. History of IBS ***  7. Chronic constipation *** - docusate sodium (COLACE) 100 MG capsule; Take 1 capsule (100 mg total) by mouth 2 (two) times daily.  Dispense: 60 capsule; Refill: 0     Follow-up: Return in about 15 days (around 08/15/2020), or if symptoms worsen or fail to improve.   [place] Jerold Coombe, NP

## 2020-08-01 LAB — COMPREHENSIVE METABOLIC PANEL
ALT: 13 IU/L (ref 0–32)
AST: 18 IU/L (ref 0–40)
Albumin/Globulin Ratio: 1.7 (ref 1.2–2.2)
Albumin: 4.5 g/dL (ref 3.8–4.8)
Alkaline Phosphatase: 81 IU/L (ref 44–121)
BUN/Creatinine Ratio: 8 — ABNORMAL LOW (ref 9–23)
BUN: 6 mg/dL (ref 6–20)
Bilirubin Total: <0.2 mg/dL (ref 0.0–1.2)
CO2: 19 mmol/L — ABNORMAL LOW (ref 20–29)
Calcium: 9.4 mg/dL (ref 8.7–10.2)
Chloride: 101 mmol/L (ref 96–106)
Creatinine, Ser: 0.73 mg/dL (ref 0.57–1.00)
Globulin, Total: 2.7 g/dL (ref 1.5–4.5)
Glucose: 84 mg/dL (ref 65–99)
Potassium: 4 mmol/L (ref 3.5–5.2)
Sodium: 136 mmol/L (ref 134–144)
Total Protein: 7.2 g/dL (ref 6.0–8.5)
eGFR: 110 mL/min/{1.73_m2} (ref >59)

## 2020-08-01 LAB — URINALYSIS
Bilirubin, UA: NEGATIVE
Glucose, UA: NEGATIVE
Ketones, UA: NEGATIVE
Leukocytes,UA: NEGATIVE
Nitrite, UA: NEGATIVE
Protein,UA: NEGATIVE
Specific Gravity, UA: 1.015 (ref 1.005–1.030)
Urobilinogen, Ur: 0.2 mg/dL (ref 0.2–1.0)
pH, UA: 7.5 (ref 5.0–7.5)

## 2020-08-01 LAB — LIPID PANEL
Chol/HDL Ratio: 3.6 ratio (ref 0.0–4.4)
Cholesterol, Total: 164 mg/dL (ref 100–199)
HDL: 46 mg/dL (ref >39)
LDL Chol Calc (NIH): 101 mg/dL — ABNORMAL HIGH (ref 0–99)
Triglycerides: 94 mg/dL (ref 0–149)
VLDL Cholesterol Cal: 17 mg/dL (ref 5–40)

## 2020-08-01 LAB — HEMOGLOBIN A1C
Est. average glucose Bld gHb Est-mCnc: 94 mg/dL
Hgb A1c MFr Bld: 4.9 % (ref 4.8–5.6)

## 2020-08-07 ENCOUNTER — Ambulatory Visit (LOCAL_COMMUNITY_HEALTH_CENTER): Payer: Medicaid Other

## 2020-08-07 ENCOUNTER — Other Ambulatory Visit: Payer: Self-pay

## 2020-08-07 VITALS — BP 114/70 | Ht 65.5 in | Wt 188.0 lb

## 2020-08-07 DIAGNOSIS — Z3201 Encounter for pregnancy test, result positive: Secondary | ICD-10-CM

## 2020-08-07 LAB — PREGNANCY, URINE: Preg Test, Ur: POSITIVE — AB

## 2020-08-07 MED ORDER — PRENATAL 27-0.8 MG PO TABS: 1.0000 | ORAL_TABLET | Freq: Every day | ORAL | 0 refills | Status: AC

## 2020-08-07 NOTE — Progress Notes (Addendum)
UPT positive. Plans prenatal care at [facility]. Reports taking multiple prescription meds. 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 Currently resides at sober living house- [address]. Wants BTL after delivery. Consult Dominique Bennett, CNM who advises pt to contact [facility] ASAP to establish prenatal care and guidance on current meds. Advised to not stop meds abruptly and to seek guidance from prescribing provider. RN carried out provider advisement. Pt in agreement. To contact [facility] this week. To DSS for Medicaid/preg women. Also, advised pt to seek immediate med assistance/go to ER if increased abd pain/bleeding. Questions answered and reports understanding. Dominique Shepherd, RN  Consulted on the plan of care for this client.  I agree with the documented note and actions taken to provide care for this client.  Dominique Bennett, CNM

## 2020-08-13 ENCOUNTER — Institutional Professional Consult (permissible substitution): Payer: Medicaid Other | Admitting: Licensed Clinical Social Worker

## 2020-08-15 ENCOUNTER — Encounter: Payer: Self-pay | Admitting: Gerontology

## 2020-08-15 ENCOUNTER — Ambulatory Visit: Payer: Medicaid Other | Admitting: Gerontology

## 2020-08-15 ENCOUNTER — Other Ambulatory Visit: Payer: Self-pay

## 2020-08-15 VITALS — BP 108/66 | HR 76 | Temp 98.8°F | Ht 66.0 in | Wt 185.0 lb

## 2020-08-15 DIAGNOSIS — Z8719 Personal history of other diseases of the digestive system: Secondary | ICD-10-CM

## 2020-08-15 DIAGNOSIS — Z3490 Encounter for supervision of normal pregnancy, unspecified, unspecified trimester: Secondary | ICD-10-CM | POA: Insufficient documentation

## 2020-08-15 DIAGNOSIS — R200 Anesthesia of skin: Secondary | ICD-10-CM

## 2020-08-15 DIAGNOSIS — F3160 Bipolar disorder, current episode mixed, unspecified: Secondary | ICD-10-CM

## 2020-08-15 NOTE — Progress Notes (Signed)
Established Patient Office Visit  Subjective:  Patient ID: Dominique Bennett, female    DOB: June 02, 1985  Age: 35 y.o. MRN: [id number]  CC: No chief complaint on file.   HPI Krystan Northrop is a 35 year old female who presents for follow up of bipolar, PTSD, possible carpel tunnel syndrome, and IBS. She recently had a positive pregnancy test on 08/07/20 and was referred to [facility]. She has a prenatal appointment scheduled for 08/20/20. She is compliant with her current medications and her mood is stable. She denies SI/HI. She was referred to [facility] Dr Justice Rocher for numbness/tingling of bilateral hands and suspected carpal tunnel syndrome. She has yet to follow up with him regarding this but was put on gabapentin for management in the interim. Her IBS is currently well-controlled with BID dosing of colace. She reports her last BM was 08/15/20. Overall, she feels well and offers no further complaints.   Past Medical History:  Diagnosis Date  . Anxiety   . Asthma   . Bipolar 1 disorder (HCC)   . Carpal tunnel syndrome   . Chronic back pain   . Depression   . IBS (irritable bowel syndrome)   . Migraines   . PTSD (post-traumatic stress disorder)   . Sleep disorder   . Substance use disorder     Past Surgical History:  Procedure Laterality Date  . CESAREAN SECTION    . LITHOTRIPSY    . TONSILLECTOMY      No family history on file.  Social History   Socioeconomic History  . Marital status: Legally Separated    Spouse name: Not on file  . Number of children: Not on file  . Years of education: Not on file  . Highest education level: Not on file  Occupational History  . Not on file  Tobacco Use  . Smoking status: Current Every Day Smoker    Packs/day: 0.50    Types: Cigarettes  . Smokeless tobacco: Never Used  Vaping Use  . Vaping Use: Some days  . Substances: Nicotine  Substance and Sexual Activity  . Alcohol use: Not Currently  . Drug use: Not Currently    Types:  "Crack" cocaine    Comment: 05/04/2020- last use-alcohol, crack, hx heroin  . Sexual activity: Not Currently    Comment: hx IUD, depo, implant, ocp  Other Topics Concern  . Not on file  Social History Narrative  . Not on file   Social Determinants of Health   Financial Resource Strain: Not on file  Food Insecurity: Not on file  Transportation Needs: Not on file  Physical Activity: Not on file  Stress: Not on file  Social Connections: Not on file  Intimate Partner Violence: Not At Risk  . Fear of Current or Ex-Partner: No  . Emotionally Abused: No  . Physically Abused: No  . Sexually Abused: No    Outpatient Medications Prior to Visit  Medication Sig Dispense Refill  . busPIRone (BUSPAR) 15 MG tablet TAKE ONE TABLET BY MOUTH 3 TIMES A DAY 90 tablet 0  . docusate sodium (COLACE) 100 MG capsule Take 1 capsule (100 mg total) by mouth 2 (two) times daily. 60 capsule 0  . FLUoxetine (PROZAC) 40 MG capsule TAKE ONE CAPSULE BY MOUTH EVERY DAY 30 capsule 0  . gabapentin (NEURONTIN) 400 MG capsule TAKE ONE CAPSULE BY MOUTH 3 TIMES A DAY 90 capsule 0  . Prenatal Vit-Fe Fumarate-FA (MULTIVITAMIN-PRENATAL) 27-0.8 MG TABS tablet Take 1 tablet by mouth daily  at 12 noon. 100 tablet 0  . QUEtiapine (SEROQUEL) 100 MG tablet TAKE 1 AND 1/2 TABLETS (150 MG) BY MOUTH EVERY MORNING AND TAKE 4.5 TABLETS (450 MG) BY MOUTHAT BEDTIME 180 tablet 0  . traZODone (DESYREL) 100 MG tablet TAKE ONE TABLET BY MOUTH AT BEDTIME AS NEEDED FOR SLEEP (Patient taking differently: Take by mouth at bedtime as needed. for sleep) 30 tablet 0   No facility-administered medications prior to visit.    Allergies  Allergen Reactions  . Tramadol Other (See Comments)    Paralysis of bladder  . Cymbalta [Duloxetine Hcl] Other (See Comments)    Altered mental status  . Other Hives    Flu vaccine  . Latex Rash  . Nickel Itching and Rash    ROS Review of Systems  Constitutional: Negative.   HENT: Negative.    Respiratory: Negative.   Cardiovascular: Negative.   Gastrointestinal: Positive for nausea. Negative for constipation and vomiting.  Genitourinary: Negative for difficulty urinating, flank pain, frequency, pelvic pain, urgency and vaginal bleeding.       Recent positive pregnancy test  Musculoskeletal: Negative.   Skin: Negative.   Neurological: Negative.   Psychiatric/Behavioral: Negative.       Objective:    Physical Exam Constitutional:      Appearance: Normal appearance. She is normal weight.  HENT:     Head: Normocephalic.  Eyes:     Pupils: Pupils are equal, round, and reactive to light.  Cardiovascular:     Rate and Rhythm: Normal rate and regular rhythm.     Pulses: Normal pulses.     Heart sounds: Normal heart sounds.  Pulmonary:     Effort: Pulmonary effort is normal.     Breath sounds: Normal breath sounds.  Abdominal:     General: Bowel sounds are normal.     Palpations: Abdomen is soft.     Tenderness: There is no abdominal tenderness.  Skin:    General: Skin is warm and dry.     Capillary Refill: Capillary refill takes less than 2 seconds.  Neurological:     Mental Status: She is alert. Mental status is at baseline.  Psychiatric:        Mood and Affect: Mood normal.        Behavior: Behavior normal.        Thought Content: Thought content normal. Thought content does not include homicidal or suicidal ideation.        Judgment: Judgment normal.     LMP 06/22/2020  Wt Readings from Last 3 Encounters:  08/07/20 188 lb (85.3 kg)  07/31/20 186 lb 11.2 oz (84.7 kg)  07/12/20 154 lb 5.2 oz (70 kg)     Health Maintenance Due  Topic Date Due  . Hepatitis C Screening  Never done  . COVID-19 Vaccine (1) Never done  . HIV Screening  Never done  . PAP SMEAR-Modifier  Never done    There are no preventive care reminders to display for this patient.  Lab Results  Component Value Date   TSH 2.707 10/02/2014   Lab Results  Component Value Date   WBC  11.5 (H) 07/12/2020   HGB 13.7 07/12/2020   HCT 40.2 07/12/2020   MCV 97.8 07/12/2020   PLT 324 07/12/2020   Lab Results  Component Value Date   NA 136 07/31/2020   K 4.0 07/31/2020   CO2 19 (L) 07/31/2020   GLUCOSE 84 07/31/2020   BUN 6 07/31/2020   CREATININE 0.73 07/31/2020  BILITOT <0.2 07/31/2020   ALKPHOS 81 07/31/2020   AST 18 07/31/2020   ALT 13 07/31/2020   PROT 7.2 07/31/2020   ALBUMIN 4.5 07/31/2020   CALCIUM 9.4 07/31/2020   ANIONGAP 8 07/12/2020   Lab Results  Component Value Date   CHOL 164 07/31/2020   Lab Results  Component Value Date   HDL 46 07/31/2020   Lab Results  Component Value Date   LDLCALC 101 (H) 07/31/2020   Lab Results  Component Value Date   TRIG 94 07/31/2020   Lab Results  Component Value Date   CHOLHDL 3.6 07/31/2020   Lab Results  Component Value Date   HGBA1C 4.9 07/31/2020      Assessment & Plan:   1. Bipolar affective disorder, current episode mixed, current episode severity unspecified (HCC) Continue current medication regimen until further evaluation by OBGYN. All medications are pregnancy class B or C with no known link to congential malformation. Go to the ED with any SI/HI or worsening mood. Discuss management with OBGYN and possible referral to cognitive behavioral therapist or psychiatrist.   2. Numbness and tingling in both hands Discontinue gabapentin due to potential risk to fetus. Continue using brace at night.   3. History of IBS Continue current medication regimen. Notify OB if symptoms worsen. Increase water intake to avoid constipation.   4. Pregnancy, unspecified gestational age Pt has applied for Medicaid and WIC. Continue prenatal vitamin daily. Smoking cessation strongly advised - QuitNow line provided. Continue to abstain from alcohol and controlled/illegal substances. Frequent, small meals advised throughout the day to help with nausea. Go to the ED if pelvic pain, vaginal bleeding, fever, chills,  or persistent vomiting occurs. Follow up with OB 08/20/20 as scheduled.     Follow-up: Follow up with your OBGYN as scheduled. Return to [facility] after delivery for continuation of primary care.     Noemi Chapel, RN, BSN, FNP-S

## 2020-08-15 NOTE — Patient Instructions (Signed)
https://www.[facility].org/womens-health/faqs/prenatal-genetic-screening-tests">  Prenatal Care Prenatal care is health care during pregnancy. It helps you and your unborn baby (fetus) stay as healthy as possible. Prenatal care may be provided by a midwife, a family practice doctor, a mid-level practitioner (nurse practitioner or physician assistant), or a childbirth and pregnancy doctor (obstetrician). How does this affect me? During pregnancy, you will be closely monitored for any new conditions that might develop. To lower your risk of pregnancy complications, you and your health care provider will talk about any underlying conditions you have. How does this affect my baby? Early and consistent prenatal care increases the chance that your baby will be healthy during pregnancy. Prenatal care lowers the risk that your baby will be:  Born early (prematurely).  Smaller than expected at birth (small for gestational age). What can I expect at the first prenatal care visit? Your first prenatal care visit will likely be the longest. You should schedule your first prenatal care visit as soon as you know that you are pregnant. Your first visit is a good time to talk about any questions or concerns you have about pregnancy. Medical history At your visit, you and your health care provider will talk about your medical history, including:  Any past pregnancies.  Your family's medical history.  Medical history of the baby's father.  Any long-term (chronic) health conditions you have and how you manage them.  Any surgeries or procedures you have had.  Any current over-the-counter or prescription medicines, herbs, or supplements that you are taking.  Other factors that could pose a risk to your baby, including: ? Exposure to harmful chemicals or radiation at work or at home. ? Any substance use, including tobacco, alcohol, and drug use.  Your home setting and your stress levels, including: ? Exposure to  abuse or violence. ? Household financial strain.  Your daily health habits, including diet and exercise. Tests and screenings Your health care provider will:  Measure your weight, height, and blood pressure.  Do a physical exam, including a pelvic and breast exam.  Perform blood tests and urine tests to check for: ? Urinary tract infection. ? Sexually transmitted infections (STIs). ? Low iron levels in your blood (anemia). ? Blood type and certain proteins on red blood cells (Rh antibodies). ? Infections and immunity to viruses, such as hepatitis B and rubella. ? HIV (human immunodeficiency virus).  Discuss your options for genetic screening. Tips about staying healthy Your health care provider will also give you information about how to keep yourself and your baby healthy, including:  Nutrition and taking vitamins.  Physical activity.  How to manage pregnancy symptoms such as nausea and vomiting (morning sickness).  Infections and substances that may be harmful to your baby and how to avoid them.  Food safety.  Dental care.  Working.  Travel.  Warning signs to watch for and when to call your health care provider. How often will I have prenatal care visits? After your first prenatal care visit, you will have regular visits throughout your pregnancy. The visit schedule is often as follows:  Up to week 28 of pregnancy: once every 4 weeks.  28-36 weeks: once every 2 weeks.  After 36 weeks: every week until delivery. Some women may have visits more or less often depending on any underlying health conditions and the health of the baby. Keep all follow-up and prenatal care visits. This is important. What happens during routine prenatal care visits? Your health care provider will:  Measure your weight   and blood pressure.  Check for fetal heart sounds.  Measure the height of your uterus in your abdomen (fundal height). This may be measured starting around week 20 of  pregnancy.  Check the position of your baby inside your uterus.  Ask questions about your diet, sleeping patterns, and whether you can feel the baby move.  Review warning signs to watch for and signs of labor.  Ask about any pregnancy symptoms you are having and how you are dealing with them. Symptoms may include: ? Headaches. ? Nausea and vomiting. ? Vaginal discharge. ? Swelling. ? Fatigue. ? Constipation. ? Changes in your vision. ? Feeling persistently sad or anxious. ? Any discomfort, including back or pelvic pain. ? Bleeding or spotting. Make a list of questions to ask your health care provider at your routine visits.   What tests might I have during prenatal care visits? You may have blood, urine, and imaging tests throughout your pregnancy, such as:  Urine tests to check for glucose, protein, or signs of infection.  Glucose tests to check for a form of diabetes that can develop during pregnancy (gestational diabetes mellitus). This is usually done around week 24 of pregnancy.  Ultrasounds to check your baby's growth and development, to check for birth defects, and to check your baby's well-being. These can also help to decide when you should deliver your baby.  A test to check for group B strep (GBS) infection. This is usually done around week 36 of pregnancy.  Genetic testing. This may include blood, fluid, or tissue sampling, or imaging tests, such as an ultrasound. Some genetic tests are done during the first trimester and some are done during the second trimester. What else can I expect during prenatal care visits? Your health care provider may recommend getting certain vaccines during pregnancy. These may include:  A yearly flu shot (annual influenza vaccine). This is especially important if you will be pregnant during flu season.  Tdap (tetanus, diphtheria, pertussis) vaccine. Getting this vaccine during pregnancy can protect your baby from whooping cough  (pertussis) after birth. This vaccine may be recommended between weeks 27 and 36 of pregnancy.  A COVID-19 vaccine. Later in your pregnancy, your health care provider may give you information about:  Childbirth and breastfeeding classes.  Choosing a health care provider for your baby.  Umbilical cord banking.  Breastfeeding.  Birth control after your baby is born.  The hospital labor and delivery unit and how to set up a tour.  Registering at the hospital before you go into labor. Where to find more information  Office on Women's Health: womenshealth.gov  American Pregnancy Association: americanpregnancy.org  March of Dimes: marchofdimes.org Summary  Prenatal care helps you and your baby stay as healthy as possible during pregnancy.  Your first prenatal care visit will most likely be the longest.  You will have visits and tests throughout your pregnancy to monitor your health and your baby's health.  Bring a list of questions to your visits to ask your health care provider.  Make sure to keep all follow-up and prenatal care visits. This information is not intended to replace advice given to you by your health care provider. Make sure you discuss any questions you have with your health care provider. Document Revised: 02/01/2020 Document Reviewed: 02/01/2020 Elsevier Patient Education  2021 Elsevier Inc.  

## 2020-08-20 ENCOUNTER — Ambulatory Visit (INDEPENDENT_AMBULATORY_CARE_PROVIDER_SITE_OTHER): Payer: Self-pay | Admitting: Obstetrics and Gynecology

## 2020-08-20 ENCOUNTER — Ambulatory Visit: Payer: Self-pay | Admitting: Specialist

## 2020-08-20 ENCOUNTER — Other Ambulatory Visit: Payer: Self-pay

## 2020-08-20 ENCOUNTER — Encounter: Payer: Self-pay | Admitting: Obstetrics and Gynecology

## 2020-08-20 VITALS — BP 143/81 | HR 75 | Ht 66.0 in | Wt 189.5 lb

## 2020-08-20 DIAGNOSIS — O34219 Maternal care for unspecified type scar from previous cesarean delivery: Secondary | ICD-10-CM

## 2020-08-20 DIAGNOSIS — F1911 Other psychoactive substance abuse, in remission: Secondary | ICD-10-CM

## 2020-08-20 DIAGNOSIS — O09521 Supervision of elderly multigravida, first trimester: Secondary | ICD-10-CM

## 2020-08-20 DIAGNOSIS — O09213 Supervision of pregnancy with history of pre-term labor, third trimester: Secondary | ICD-10-CM

## 2020-08-20 DIAGNOSIS — Z7689 Persons encountering health services in other specified circumstances: Secondary | ICD-10-CM
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
## 2020-08-30 ENCOUNTER — Emergency Department: Payer: BLUE CROSS/BLUE SHIELD

## 2020-08-30 ENCOUNTER — Other Ambulatory Visit: Payer: Self-pay

## 2020-08-30 ENCOUNTER — Inpatient Hospital Stay
Admission: EM | Admit: 2020-08-30 | Discharge: 2020-09-03 | DRG: 818 | Disposition: A | Discharge status: Home / Self Care | Payer: BLUE CROSS/BLUE SHIELD | Attending: Internal Medicine | Admitting: Internal Medicine

## 2020-08-30 ENCOUNTER — Encounter: Payer: Self-pay | Admitting: Emergency Medicine

## 2020-08-30 DIAGNOSIS — Z91048 Other nonmedicinal substance allergy status: Secondary | ICD-10-CM

## 2020-08-30 DIAGNOSIS — O99342 Other mental disorders complicating pregnancy, second trimester: Secondary | ICD-10-CM | POA: Diagnosis present

## 2020-08-30 DIAGNOSIS — F1120 Opioid dependence, uncomplicated: Secondary | ICD-10-CM | POA: Diagnosis present

## 2020-08-30 DIAGNOSIS — F319 Bipolar disorder, unspecified: Secondary | ICD-10-CM | POA: Diagnosis present

## 2020-08-30 DIAGNOSIS — O99612 Diseases of the digestive system complicating pregnancy, second trimester: Secondary | ICD-10-CM | POA: Diagnosis present

## 2020-08-30 DIAGNOSIS — K581 Irritable bowel syndrome with constipation: Secondary | ICD-10-CM | POA: Diagnosis present

## 2020-08-30 DIAGNOSIS — Z3A14 14 weeks gestation of pregnancy: Secondary | ICD-10-CM | POA: Diagnosis not present

## 2020-08-30 DIAGNOSIS — G8929 Other chronic pain: Secondary | ICD-10-CM | POA: Diagnosis present

## 2020-08-30 DIAGNOSIS — Z888 Allergy status to other drugs, medicaments and biological substances status: Secondary | ICD-10-CM

## 2020-08-30 DIAGNOSIS — G479 Sleep disorder, unspecified: Secondary | ICD-10-CM | POA: Diagnosis present

## 2020-08-30 DIAGNOSIS — F32A Depression, unspecified: Secondary | ICD-10-CM | POA: Diagnosis not present

## 2020-08-30 DIAGNOSIS — F4310 Post-traumatic stress disorder, unspecified: Secondary | ICD-10-CM | POA: Diagnosis present

## 2020-08-30 DIAGNOSIS — F17210 Nicotine dependence, cigarettes, uncomplicated: Secondary | ICD-10-CM | POA: Diagnosis present

## 2020-08-30 DIAGNOSIS — Z885 Allergy status to narcotic agent status: Secondary | ICD-10-CM

## 2020-08-30 DIAGNOSIS — O2301 Infections of kidney in pregnancy, first trimester: Secondary | ICD-10-CM

## 2020-08-30 DIAGNOSIS — Z20822 Contact with and (suspected) exposure to covid-19: Secondary | ICD-10-CM | POA: Diagnosis present

## 2020-08-30 DIAGNOSIS — O021 Missed abortion: Secondary | ICD-10-CM

## 2020-08-30 DIAGNOSIS — Z8719 Personal history of other diseases of the digestive system: Secondary | ICD-10-CM

## 2020-08-30 DIAGNOSIS — F419 Anxiety disorder, unspecified: Secondary | ICD-10-CM | POA: Diagnosis not present

## 2020-08-30 DIAGNOSIS — R102 Pelvic and perineal pain: Secondary | ICD-10-CM

## 2020-08-30 DIAGNOSIS — N12 Tubulo-interstitial nephritis, not specified as acute or chronic: Secondary | ICD-10-CM | POA: Diagnosis present

## 2020-08-30 DIAGNOSIS — O99322 Drug use complicating pregnancy, second trimester: Secondary | ICD-10-CM | POA: Diagnosis present

## 2020-08-30 DIAGNOSIS — F3160 Bipolar disorder, current episode mixed, unspecified: Secondary | ICD-10-CM | POA: Diagnosis not present

## 2020-08-30 DIAGNOSIS — Z79899 Other long term (current) drug therapy: Secondary | ICD-10-CM

## 2020-08-30 DIAGNOSIS — Z91040 Latex allergy status: Secondary | ICD-10-CM

## 2020-08-30 DIAGNOSIS — O2302 Infections of kidney in pregnancy, second trimester: Principal | ICD-10-CM | POA: Diagnosis present

## 2020-08-30 LAB — URINALYSIS, COMPLETE (UACMP) WITH MICROSCOPIC
Bilirubin Urine: NEGATIVE
Bilirubin Urine: NEGATIVE
Glucose, UA: NEGATIVE mg/dL
Glucose, UA: NEGATIVE mg/dL
Ketones, ur: NEGATIVE mg/dL
Ketones, ur: 20 mg/dL — AB
Nitrite: NEGATIVE
Nitrite: NEGATIVE
Protein, ur: 30 mg/dL — AB
Protein, ur: NEGATIVE mg/dL
RBC / HPF: >50 RBC/hpf — ABNORMAL HIGH (ref 0–5)
RBC / HPF: >50 RBC/hpf — ABNORMAL HIGH (ref 0–5)
Specific Gravity, Urine: 1.021 (ref 1.005–1.030)
Specific Gravity, Urine: 1.02 (ref 1.005–1.030)
WBC, UA: >50 WBC/hpf — ABNORMAL HIGH (ref 0–5)
WBC, UA: >50 WBC/hpf — ABNORMAL HIGH (ref 0–5)
pH: 7 (ref 5.0–8.0)
pH: 6 (ref 5.0–8.0)

## 2020-08-30 LAB — CBC
HCT: 39.3 % (ref 36.0–46.0)
Hemoglobin: 13.4 g/dL (ref 12.0–15.0)
MCH: 33.8 pg (ref 26.0–34.0)
MCHC: 34.1 g/dL (ref 30.0–36.0)
MCV: 99.2 fL (ref 80.0–100.0)
Platelets: 290 10*3/uL (ref 150–400)
RBC: 3.96 MIL/uL (ref 3.87–5.11)
RDW: 12.7 % (ref 11.5–15.5)
WBC: 10.2 10*3/uL (ref 4.0–10.5)
nRBC: 0 % (ref 0.0–0.2)

## 2020-08-30 LAB — BASIC METABOLIC PANEL
Anion gap: 7 (ref 5–15)
BUN: 8 mg/dL (ref 6–20)
CO2: 20 mmol/L — ABNORMAL LOW (ref 22–32)
Calcium: 8.7 mg/dL — ABNORMAL LOW (ref 8.9–10.3)
Chloride: 107 mmol/L (ref 98–111)
Creatinine, Ser: 0.66 mg/dL (ref 0.44–1.00)
GFR, Estimated: >60 mL/min (ref >60)
Glucose, Bld: 97 mg/dL (ref 70–99)
Potassium: 3.3 mmol/L — ABNORMAL LOW (ref 3.5–5.1)
Sodium: 134 mmol/L — ABNORMAL LOW (ref 135–145)

## 2020-08-30 LAB — HCG, QUANTITATIVE, PREGNANCY: hCG, Beta Chain, Quant, S: 17270 m[IU]/mL — ABNORMAL HIGH (ref <5)

## 2020-08-30 MED ORDER — SODIUM CHLORIDE 0.9 % IV SOLN
1.0000 g | INTRAVENOUS | Status: DC
  Administered 2020-08-31 – 2020-09-02 (×3): 1 g via INTRAVENOUS
  Filled 2020-08-30 (×5): qty 10

## 2020-08-30 MED ORDER — ALBUTEROL SULFATE (2.5 MG/3ML) 0.083% IN NEBU: 2.5000 mg | INHALATION_SOLUTION | Freq: Four times a day (QID) | RESPIRATORY_TRACT | Status: DC | PRN

## 2020-08-30 MED ORDER — ACETAMINOPHEN 650 MG RE SUPP: 650.0000 mg | Freq: Four times a day (QID) | RECTAL | Status: DC | PRN

## 2020-08-30 MED ORDER — SENNOSIDES-DOCUSATE SODIUM 8.6-50 MG PO TABS: 1.0000 | ORAL_TABLET | Freq: Every evening | ORAL | Status: DC | PRN

## 2020-08-30 MED ORDER — SODIUM CHLORIDE 0.9 % IV SOLN: INTRAVENOUS | Status: DC

## 2020-08-30 MED ORDER — ACETAMINOPHEN 325 MG PO TABS: 650.0000 mg | ORAL_TABLET | Freq: Four times a day (QID) | ORAL | Status: DC | PRN

## 2020-08-30 MED ORDER — IPRATROPIUM BROMIDE 0.02 % IN SOLN: 0.5000 mg | Freq: Four times a day (QID) | RESPIRATORY_TRACT | Status: DC | PRN

## 2020-08-30 MED ORDER — ONDANSETRON HCL 4 MG/2ML IJ SOLN
4.0000 mg | Freq: Once | INTRAMUSCULAR | Status: AC
  Administered 2020-08-30: 4 mg via INTRAVENOUS
  Filled 2020-08-30: qty 2

## 2020-08-30 MED ORDER — SODIUM CHLORIDE 0.9 % IV SOLN
1.0000 g | Freq: Once | INTRAVENOUS | Status: AC
  Administered 2020-08-30: 1 g via INTRAVENOUS
  Filled 2020-08-30: qty 10

## 2020-08-30 MED ORDER — BISACODYL 5 MG PO TBEC: 5.0000 mg | DELAYED_RELEASE_TABLET | Freq: Every day | ORAL | Status: DC | PRN

## 2020-08-30 MED ORDER — LACTATED RINGERS IV BOLUS
1000.0000 mL | Freq: Once | INTRAVENOUS | Status: AC
  Administered 2020-08-30: 1000 mL via INTRAVENOUS

## 2020-08-30 MED ORDER — ONDANSETRON HCL 4 MG/2ML IJ SOLN
4.0000 mg | Freq: Four times a day (QID) | INTRAMUSCULAR | Status: DC | PRN
  Administered 2020-09-01: 4 mg via INTRAVENOUS
  Filled 2020-08-30: qty 2

## 2020-08-30 MED ORDER — MAGNESIUM CITRATE PO SOLN
1.0000 | Freq: Once | ORAL | Status: DC | PRN
  Filled 2020-08-30: qty 296

## 2020-08-30 MED ORDER — MORPHINE SULFATE (PF) 4 MG/ML IV SOLN
4.0000 mg | Freq: Once | INTRAVENOUS | Status: AC
Start: 2020-08-30 — End: 2020-08-30
  Administered 2020-08-30: 4 mg via INTRAVENOUS
  Filled 2020-08-30: qty 1

## 2020-08-30 MED ORDER — ONDANSETRON HCL 4 MG PO TABS: 4.0000 mg | ORAL_TABLET | Freq: Four times a day (QID) | ORAL | Status: DC | PRN

## 2020-08-30 NOTE — ED Notes (Signed)
Pt with ultrasound 

## 2020-08-30 NOTE — ED Triage Notes (Signed)
Pt to ED via POV c/o left flank pain since last night. Pt states that she has hx/o kidney stones. Pt is also [redacted] weeks pregnant. Pt denies any other symptoms at this time.

## 2020-08-30 NOTE — ED Notes (Signed)
Pt c/o left flank pain and nausea since last night, see also the triage note. Pt denies emesis/diarrhea.

## 2020-08-30 NOTE — ED Notes (Signed)
Patient requesting pain medication at this time, refusing tylenol. States "they told me they were going to give me something stronger than that, I'll just wait".

## 2020-08-30 NOTE — H&P (Signed)
History and Physical   [facility] [place] [facility] Ad[facility] History and Physical [employer] Jimmy Plessinger, D.O.    Patient Name: Dominique Bennett MR#: [id number] Date of Birth: 10/16/1985 Date of Admission: 08/30/2020  Referring MD/NP/PA: Dr. Larinda ButteryJessup Primary Care Physician: Pcp, No  Chief Complaint:  Chief Complaint  Patient presents with  . Flank Pain    HPI: Dominique Bennett is a 35 y.o. female G8 P0-4-3-4 currently [redacted] weeks pregnant by dates with a known history of anxiety, asthma, bipolar disorder, carpal tunnel syndrome, chronic back pain, depression, IBS, migraines, PTSD, sleep disorder and history of substance use disorder presents to the emergency department for evaluation of left flank pain.  Patient was in a usual state of health until 1 night prior to arrival in the emergency department she developed sharp constant left flank pain associated with dysuria, nausea.  She does have a history of kidney stones.  Of note she denies any lower abdominal cramping, discharge, vaginal bleeding.  She recently established her obstetric care with [facility], Dr. Logan BoresEvans.  Patient denies fevers/chills, weakness, dizziness, chest pain, shortness of breath, N/V/C/D, abdominal pain, dysuria/frequency, changes in mental status.    Otherwise there has been no change in status. Patient has been taking medication as prescribed and there has been no recent change in medication or diet.  No recent antibiotics.  There has been no recent illness, hospitalizations, travel or sick contacts.    EMS/ED Course: Patient received Rocephin, LR, morphine, Zofran. Medical admission has been requested for further management of pyelonephritis.  Review of Systems:  CONSTITUTIONAL: Negative fever/chills, fatigue, weakness, weight gain/loss, headache. EYES: No blurry or double vision. ENT: No tinnitus, postnasal drip, redness or soreness of the oropharynx. RESPIRATORY: No cough, dyspnea, wheeze.  No hemoptysis.   CARDIOVASCULAR: No chest pain, palpitations, syncope, orthopnea. No lower extremity edema.  GASTROINTESTINAL: No nausea, vomiting, abdominal pain, diarrhea, constipation.  No hematemesis, melena or hematochezia. GENITOURINARY: Positive low flank pain pain and dysuria, f negative requency, hematuria. ENDOCRINE: No polyuria or nocturia. No heat or cold intolerance. HEMATOLOGY: No anemia, bruising, bleeding. INTEGUMENTARY: No rashes, ulcers, lesions. MUSCULOSKELETAL: No arthritis, gout, dyspnea. NEUROLOGIC: No numbness, tingling, ataxia, seizure-type activity, weakness. PSYCHIATRIC: No anxiety, depression, insomnia.   Past Medical History:  Diagnosis Date  . Anxiety   . Asthma   . Bipolar 1 disorder (HCC)   . Carpal tunnel syndrome   . Chronic back pain   . Depression   . IBS (irritable bowel syndrome)   . Migraines   . PTSD (post-traumatic stress disorder)   . Sleep disorder   . Substance use disorder     Past Surgical History:  Procedure Laterality Date  . CESAREAN SECTION    . LITHOTRIPSY    . TONSILLECTOMY       reports that she has been smoking cigarettes. She has been smoking about 0.50 packs per day. She has never used smokeless tobacco. She reports previous alcohol use. She reports previous drug use. Drug: "Crack" cocaine.  Allergies  Allergen Reactions  . Duloxetine Other (See Comments)    Suicidal Patient states it causes her to think about suicide   . Egg Shells Anaphylaxis  . Ibuprofen Other (See Comments)    "kidneys start to shut down"  . Tramadol Other (See Comments)    Paralysis of bladder  . Fluoxetine Rash  . Cymbalta [Duloxetine Hcl] Other (See Comments)    Altered mental status  . Other Hives    Flu vaccine  . Latex Rash  .  Nickel Itching and Rash    No family history on file.  Prior to Admission medications   Medication Sig Start Date End Date Taking? Authorizing Provider  busPIRone (BUSPAR) 15 MG tablet TAKE ONE TABLET BY MOUTH 3 TIMES A  DAY 07/31/20 10/01/20 Yes Iloabachie, Chioma E, NP  QUEtiapine (SEROQUEL) 100 MG tablet TAKE 1 AND 1/2 TABLETS (150 MG) BY MOUTH EVERY MORNING AND TAKE 4.5 TABLETS (450 MG) BY MOUTHAT BEDTIME 07/31/20 07/31/21 Yes Iloabachie, Chioma E, NP  traZODone (DESYREL) 100 MG tablet TAKE ONE TABLET BY MOUTH AT BEDTIME AS NEEDED FOR SLEEP Patient taking differently: Take 100 mg by mouth at bedtime as needed. for sleep 07/31/20 07/31/21 Yes Iloabachie, Chioma E, NP  docusate sodium (COLACE) 100 MG capsule Take 1 capsule (100 mg total) by mouth 2 (two) times daily. 07/31/20   Iloabachie, Chioma E, NP  FLUoxetine (PROZAC) 40 MG capsule TAKE ONE CAPSULE BY MOUTH EVERY DAY Patient not taking: Reported on 08/30/2020 07/31/20 07/31/21  Iloabachie, Chioma E, NP  Prenatal Vit-Fe Fumarate-FA (MULTIVITAMIN-PRENATAL) 27-0.8 MG TABS tablet Take 1 tablet by mouth daily at 12 noon. 08/07/20 11/15/20  Federico Flake, MD    Physical Exam: Vitals:   08/30/20 1730 08/30/20 1800 08/30/20 1915 08/30/20 1930  BP: 106/62 120/60 122/63 111/71  Pulse: 61 62 63 62  Resp: 16 18 17 16   Temp:      TempSrc:      SpO2: 99% 98% 99% 97%  Weight:      Height:        GENERAL: 35 y.o.-year-old white female patient, well-developed, well-nourished lying in the bed in no acute distress.  Pleasant and cooperative.   HEENT: Head atraumatic, normocephalic. Pupils equal. Mucus membranes moist. NECK: Supple. No JVD. CHEST: Normal breath sounds bilaterally. No wheezing, rales, rhonchi or crackles. No use of accessory muscles of respiration.  No reproducible chest wall tenderness.  CARDIOVASCULAR: S1, S2 normal. No murmurs, rubs, or gallops. Cap refill <2 seconds. Pulses intact distally.  ABDOMEN: Soft, nondistended, positive L CVA and suprapubic tenderness.  No rebound, guarding, rigidity. Normoactive bowel sounds present in all four quadrants.  EXTREMITIES: No pedal edema, cyanosis, or clubbing. No calf tenderness or Homan's sign.  NEUROLOGIC:  The patient is alert and oriented x 3. Cranial nerves II through XII are grossly intact with no focal sensorimotor deficit. PSYCHIATRIC:  Normal affect, mood, thought content. SKIN: Warm, dry, and intact without obvious rash, lesion, or ulcer.    Labs on Admission:  CBC: Recent Labs  Lab 08/30/20 1400  WBC 10.2  HGB 13.4  HCT 39.3  MCV 99.2  PLT 290   Basic Metabolic Panel: Recent Labs  Lab 08/30/20 1400  NA 134*  K 3.3*  CL 107  CO2 20*  GLUCOSE 97  BUN 8  CREATININE 0.66  CALCIUM 8.7*   GFR: Estimated Creatinine Clearance: 108.6 mL/min (by C-G formula based on SCr of 0.66 mg/dL). Liver Function Tests: No results for input(s): AST, ALT, ALKPHOS, BILITOT, PROT, ALBUMIN in the last 168 hours. No results for input(s): LIPASE, AMYLASE in the last 168 hours. No results for input(s): AMMONIA in the last 168 hours. Coagulation Profile: No results for input(s): INR, PROTIME in the last 168 hours. Cardiac Enzymes: No results for input(s): CKTOTAL, CKMB, CKMBINDEX, TROPONINI in the last 168 hours. BNP (last 3 results) No results for input(s): PROBNP in the last 8760 hours. HbA1C: No results for input(s): HGBA1C in the last 72 hours. CBG: No results for input(s): GLUCAP  in the last 168 hours. Lipid Profile: No results for input(s): CHOL, HDL, LDLCALC, TRIG, CHOLHDL, LDLDIRECT in the last 72 hours. Thyroid Function Tests: No results for input(s): TSH, T4TOTAL, FREET4, T3FREE, THYROIDAB in the last 72 hours. Anemia Panel: No results for input(s): VITAMINB12, FOLATE, FERRITIN, TIBC, IRON, RETICCTPCT in the last 72 hours. Urine analysis:    Component Value Date/Time   COLORURINE YELLOW (A) 08/30/2020 1750   APPEARANCEUR HAZY (A) 08/30/2020 1750   APPEARANCEUR Clear 07/31/2020 1112   LABSPEC 1.020 08/30/2020 1750   PHURINE 6.0 08/30/2020 1750   GLUCOSEU NEGATIVE 08/30/2020 1750   HGBUR MODERATE (A) 08/30/2020 1750   BILIRUBINUR NEGATIVE 08/30/2020 1750   BILIRUBINUR  Negative 07/31/2020 1112   KETONESUR 20 (A) 08/30/2020 1750   PROTEINUR NEGATIVE 08/30/2020 1750   UROBILINOGEN 0.2 10/03/2014 1307   NITRITE NEGATIVE 08/30/2020 1750   LEUKOCYTESUR MODERATE (A) 08/30/2020 1750   Sepsis Labs: @LABRCNTIP (procalcitonin:4,lacticidven:4) )No results found for this or any previous visit (from the past 240 hour(s)).   Radiological Exams on Admission: Renal  Result Date: 08/30/2020 CLINICAL DATA:  Flank pain EXAM: RENAL / URINARY TRACT ULTRASOUND COMPLETE COMPARISON:  CT abdomen 02/16/2014 FINDINGS: Right Kidney: Renal measurements: 11 x 6.8 x 5 cm = volume: 195.6 mL. Echogenicity within normal limits. No mass or hydronephrosis visualized. Left Kidney: Renal measurements: 11 x 5.6 x 5.1 cm = volume: 166.2 mL. Echogenicity within normal limits. No mass or hydronephrosis visualized. Bladder: Appears normal for degree of bladder distention. Other: None. IMPRESSION: 1. Normal renal ultrasound. Electronically Signed   By: 02/18/2014   On: 08/30/2020 19:11   09/01/2020 OB LESS THAN 14 WEEKS WITH OB TRANSVAGINAL  Result Date: 08/30/2020 CLINICAL DATA:  Initial evaluation for acute left adnexal pain for 1 day. Early pregnancy. EXAM: OBSTETRIC <14 WK 09/01/2020 AND TRANSVAGINAL OB US TECHNIQUE: Both transabdominal and transvaginal ultrasound examinations were performed for complete evaluation of the gestation as well as the maternal uterus, adnexal regions, and pelvic cul-de-sac. Transvaginal technique was performed to assess early pregnancy. COMPARISON:  None available. FINDINGS: Intrauterine gestational sac: Single Yolk sac:  Negative. Embryo:  Present. Cardiac Activity: Negative. Heart Rate: N/A CRL: 3.5 mm   6 w   0 d                  [place] EDC: 04/25/2021 Subchorionic hemorrhage: Small subchorionic hemorrhage without mass effect. Maternal uterus/adnexae: Ovaries within normal limits bilaterally. 1.8 cm degenerating corpus luteal cyst noted within the left ovary. No adnexal mass. Small  volume free fluid present within the pelvis. IMPRESSION: 1. Single intrauterine pregnancy with internal embryo, but no detectable cardiac activity. Crown-rump length measures 3.5 mm. Findings are suspicious but not yet definitive for failed pregnancy. Recommend follow-up 04/27/2021 in 10-14 days for definitive diagnosis. This recommendation follows SRU consensus guidelines: Diagnostic Criteria for Nonviable Pregnancy Early in the First Trimester. 01-27-1979 Med [id number]. 2. Small subchorionic hemorrhage without mass effect. 3. 1.8 cm degenerating left ovarian corpus luteal cyst with small volume free fluid in the pelvis. Finding could contribute to acute left adnexal pain. Electronically Signed   By: ; [id number] M.D.   On: 08/30/2020 19:36    Assessment/Plan  This is a 35 y.o. female with a history of anxiety, asthma, bipolar disorder, carpal tunnel syndrome, chronic back pain, depression, IBS, migraines, PTSD, sleep disorder and history of substance use disorder now being admitted with:  #.  Pyelonephritis - Admit inpatient - IV Rocephin - IV fluids -  Follow-up urine cultures -Please strain all urines  #.  Possible spontaneous abortion, ultrasound indicates crown-rump length measuring 6 weeks when patient is 10 weeks by dates, no fetal heart tones identified I did place a call to Dr. Valentino Saxon of [facility] who will consult on the patient. In the meantime continue prenatal vitamin  #.  History of polysubstance abuse - Check EtOH and urine drug screen  #. History of history of anxiety, depression, bipolar disorder and PTSD - Continue Prozac, Seroquel, trazodone.  Per OB notes it was recommended that she wean down off of all these medications but patient believes they are necessary to keep her from substance use.   Admission status: Inpatient IV Fluids: Normal saline Diet/Nutrition: Regular Consults called: Obstetrics DVT Px: SCDs and early ambulation. Code Status: Full Code   Disposition Plan: To home in 1-2 days  All the records are reviewed and case discussed with ED provider. Management plans discussed with the patient and/or family who express understanding and agree with plan of care.  Thoams Siefert D.O. on 08/30/2020 at 8:12 PM CC: Primary care physician; Pcp, No   08/30/2020, 8:12 PM

## 2020-08-30 NOTE — ED Provider Notes (Signed)
[facility] Emergency Department Provider Note   ____________________________________________   Event Date/Time   First MD Initiated Contact with Patient 08/30/20 1657     (approximate)  I have reviewed the triage vital signs and the nursing notes.   HISTORY  Chief Complaint Flank Pain    HPI Dominique Bennett is a 35 y.o. female, [phone] at approximately 10 weeks of pregnancy who presents to the ED complaining of flank pain.  Patient reports that she has had increasing pain in her left flank since last night.  She describes it as constant and sharp, not exacerbated or alleviated by anything.  She has had some dysuria for the past couple of days associated with nausea, denies any fevers or chills.  She describes current symptoms as similar to prior kidney stones, which she reports a long history of.  She has not had any vaginal bleeding, discharge, or changes in bowel movements.  She recently established care with [facility] as her OB/GYN, has not yet had an ultrasound this pregnancy.        Past Medical History:  Diagnosis Date  . Anxiety   . Asthma   . Bipolar 1 disorder (HCC)   . Carpal tunnel syndrome   . Chronic back pain   . Depression   . IBS (irritable bowel syndrome)   . Migraines   . PTSD (post-traumatic stress disorder)   . Sleep disorder   . Substance use disorder     Patient Active Problem List   Diagnosis Date Noted  . Pregnancy 08/15/2020  . Chronic back pain 07/31/2020  . Back pain 07/31/2020  . Numbness and tingling in both hands 07/31/2020  . History of IBS 07/31/2020  . Bipolar affective disorder (HCC)   . Hematuria   . PTSD (post-traumatic stress disorder) 10/01/2014  . Opioid use disorder, moderate, dependence (HCC) 10/01/2014  . 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 Bipolar disorder (HCC) 10/01/2014    Past Surgical History:  Procedure Laterality Date  . CESAREAN SECTION     . LITHOTRIPSY    . TONSILLECTOMY      Prior to Admission medications   Medication Sig Start Date End Date Taking? Authorizing Provider  busPIRone (BUSPAR) 15 MG tablet TAKE ONE TABLET BY MOUTH 3 TIMES A DAY 07/31/20 10/01/20  Iloabachie, Chioma E, NP  docusate sodium (COLACE) 100 MG capsule Take 1 capsule (100 mg total) by mouth 2 (two) times daily. 07/31/20   Iloabachie, Chioma E, NP  FLUoxetine (PROZAC) 40 MG capsule TAKE ONE CAPSULE BY MOUTH EVERY DAY 07/31/20 07/31/21  Iloabachie, Chioma E, NP  Prenatal Vit-Fe Fumarate-FA (MULTIVITAMIN-PRENATAL) 27-0.8 MG TABS tablet Take 1 tablet by mouth daily at 12 noon. 08/07/20 11/15/20  Federico Flake, MD  QUEtiapine (SEROQUEL) 100 MG tablet TAKE 1 AND 1/2 TABLETS (150 MG) BY MOUTH EVERY MORNING AND TAKE 4.5 TABLETS (450 MG) BY MOUTHAT BEDTIME 07/31/20 07/31/21  Iloabachie, Chioma E, NP  traZODone (DESYREL) 100 MG tablet TAKE ONE TABLET BY MOUTH AT BEDTIME AS NEEDED FOR SLEEP Patient taking differently: Take by mouth at bedtime as needed. for sleep 07/31/20 07/31/21  Iloabachie, Chioma E, NP    Allergies Tramadol, Cymbalta [duloxetine hcl], Other, Latex, and Nickel  No family history on file.  Social History Social History   Tobacco Use  . Smoking status: Current Every Day Smoker    Packs/day: 0.50    Types: Cigarettes  . Smokeless tobacco: Never Used  Vaping Use  .  Vaping Use: Some days  . Substances: Nicotine  Substance Use Topics  . Alcohol use: Not Currently  . Drug use: Not Currently    Types: "Crack" cocaine    Comment: 05/04/2020- last use-alcohol, crack, hx heroin    Review of Systems  Constitutional: No fever/chills Eyes: No visual changes. ENT: No sore throat. Cardiovascular: Denies chest pain. Respiratory: Denies shortness of breath. Gastrointestinal: No abdominal pain.  Positive for flank pain.  Positive for nausea, no vomiting.  No diarrhea.  No constipation. Genitourinary: Positive for dysuria. Musculoskeletal:  Negative for back pain. Skin: Negative for rash. Neurological: Negative for headaches, focal weakness or numbness.  ____________________________________________   PHYSICAL EXAM:  VITAL SIGNS: ED Triage Vitals  Enc Vitals Group     BP 08/30/20 1349 121/73     Pulse Rate 08/30/20 1349 67     Resp 08/30/20 1349 18     Temp 08/30/20 1349 97.6 F (36.4 C)     Temp Source 08/30/20 1349 Oral     SpO2 08/30/20 1349 100 %     Weight 08/30/20 1349 190 lb (86.2 kg)     Height 08/30/20 1349 5\' 6"  (1.676 m)     Head Circumference --      Peak Flow --      Pain Score 08/30/20 1354 9     Pain Loc --      Pain Edu? --      Excl. in GC? --     Constitutional: Alert and oriented. Eyes: Conjunctivae are normal. Head: Atraumatic. Nose: No congestion/rhinnorhea. Mouth/Throat: Mucous membranes are moist. Neck: Normal ROM Cardiovascular: Normal rate, regular rhythm. Grossly normal heart sounds. Respiratory: Normal respiratory effort.  No retractions. Lungs CTAB. Gastrointestinal: Soft and nontender.  Left CVA tenderness noted.  No distention. Genitourinary: deferred Musculoskeletal: No lower extremity tenderness nor edema. Neurologic:  Normal speech and language. No gross focal neurologic deficits are appreciated. Skin:  Skin is warm, dry and intact. No rash noted. Psychiatric: Mood and affect are normal. Speech and behavior are normal.  ____________________________________________   LABS (all labs ordered are listed, but only abnormal results are displayed)  Labs Reviewed  URINALYSIS, COMPLETE (UACMP) WITH MICROSCOPIC - Abnormal; Notable for the following components:      Result Value   Color, Urine YELLOW (*)    APPearance CLOUDY (*)    Hgb urine dipstick MODERATE (*)    Protein, ur 30 (*)    Leukocytes,Ua MODERATE (*)    RBC / HPF >50 (*)    WBC, UA >50 (*)    Bacteria, UA FEW (*)    All other components within normal limits  BASIC METABOLIC PANEL - Abnormal; Notable for the  following components:   Sodium 134 (*)    Potassium 3.3 (*)    CO2 20 (*)    Calcium 8.7 (*)    All other components within normal limits  URINALYSIS, COMPLETE (UACMP) WITH MICROSCOPIC - Abnormal; Notable for the following components:   Color, Urine YELLOW (*)    APPearance HAZY (*)    Hgb urine dipstick MODERATE (*)    Ketones, ur 20 (*)    Leukocytes,Ua MODERATE (*)    RBC / HPF >50 (*)    WBC, UA >50 (*)    Bacteria, UA RARE (*)    All other components within normal limits  HCG, QUANTITATIVE, PREGNANCY - Abnormal; Notable for the following components:   hCG, Beta Chain, Quant, S 17,270 (*)    All other components  within normal limits  URINE CULTURE  SARS CORONAVIRUS 2 (TAT 6-24 HRS)  CBC    PROCEDURES  Procedure(s) performed (including Critical Care):  Procedures   ____________________________________________   INITIAL IMPRESSION / ASSESSMENT AND PLAN / ED COURSE       35 year old female, G8 P0-4-3-0 at approximately 10 weeks of pregnancy who presents to the ED complaining of 24 hours of increasing flank pain associated with nausea and dysuria.  She describes current symptoms as similar to prior kidney stones, although her symptomatology is concerning for pyelonephritis.  Unfortunately, her initial urine sample is difficult to interpret given significant squamous cells.  We will recollect second urine sample and patient instructed on method to obtain clean-catch.  Labs thus far are reassuring, we will add on beta hCG and further assess with renal ultrasound and obstetric ultrasound.  Patient reports significant pain and we will treat symptomatically with IV morphine and Zofran.  Renal ultrasound shows no evidence of hydronephrosis, UA does appear grossly infected at this point her symptoms seem more consistent with pyelonephritis than nephrolithiasis.  We will treat with Rocephin and send urine for culture.  Obstetric ultrasound is concerning for failed pregnancy as fetal  heart tones are not yet visible.  I would also consider very early pregnancy and patient will require follow-up with pyelonephritis in the setting of pregnancy Case discussed with hospitalist for admission.      ____________________________________________   FINAL CLINICAL IMPRESSION(S) / ED DIAGNOSES  Final diagnoses:  Pyelonephritis affecting pregnancy in first trimester     ED Discharge Orders    None       Note:  This document was prepared using Dragon voice recognition software and may include unintentional dictation errors.   Chesley Noon, MD 08/30/20 2000

## 2020-08-31 DIAGNOSIS — N12 Tubulo-interstitial nephritis, not specified as acute or chronic: Secondary | ICD-10-CM

## 2020-08-31 DIAGNOSIS — O3680X0 Pregnancy with inconclusive fetal viability, not applicable or unspecified: Secondary | ICD-10-CM

## 2020-08-31 DIAGNOSIS — F32A Depression, unspecified: Secondary | ICD-10-CM

## 2020-08-31 DIAGNOSIS — F319 Bipolar disorder, unspecified: Secondary | ICD-10-CM

## 2020-08-31 LAB — COMPREHENSIVE METABOLIC PANEL
ALT: 12 U/L (ref 0–44)
AST: 13 U/L — ABNORMAL LOW (ref 15–41)
Albumin: 3.2 g/dL — ABNORMAL LOW (ref 3.5–5.0)
Alkaline Phosphatase: 45 U/L (ref 38–126)
Anion gap: 6 (ref 5–15)
BUN: 8 mg/dL (ref 6–20)
CO2: 20 mmol/L — ABNORMAL LOW (ref 22–32)
Calcium: 8.5 mg/dL — ABNORMAL LOW (ref 8.9–10.3)
Chloride: 110 mmol/L (ref 98–111)
Creatinine, Ser: 0.56 mg/dL (ref 0.44–1.00)
GFR, Estimated: >60 mL/min (ref >60)
Glucose, Bld: 89 mg/dL (ref 70–99)
Potassium: 3.6 mmol/L (ref 3.5–5.1)
Sodium: 136 mmol/L (ref 135–145)
Total Bilirubin: 0.5 mg/dL (ref 0.3–1.2)
Total Protein: 6 g/dL — ABNORMAL LOW (ref 6.5–8.1)

## 2020-08-31 LAB — CBC
HCT: 34.8 % — ABNORMAL LOW (ref 36.0–46.0)
Hemoglobin: 12 g/dL (ref 12.0–15.0)
MCH: 34.1 pg — ABNORMAL HIGH (ref 26.0–34.0)
MCHC: 34.5 g/dL (ref 30.0–36.0)
MCV: 98.9 fL (ref 80.0–100.0)
Platelets: 244 10*3/uL (ref 150–400)
RBC: 3.52 MIL/uL — ABNORMAL LOW (ref 3.87–5.11)
RDW: 12.8 % (ref 11.5–15.5)
WBC: 7.6 10*3/uL (ref 4.0–10.5)
nRBC: 0 % (ref 0.0–0.2)

## 2020-08-31 LAB — HIV ANTIBODY (ROUTINE TESTING W REFLEX): HIV Screen 4th Generation wRfx: NONREACTIVE

## 2020-08-31 LAB — SARS CORONAVIRUS 2 (TAT 6-24 HRS): SARS Coronavirus 2: NEGATIVE

## 2020-08-31 MED ORDER — MORPHINE SULFATE (PF) 2 MG/ML IV SOLN
2.0000 mg | INTRAVENOUS | Status: DC | PRN
  Administered 2020-08-31 – 2020-09-01 (×9): 2 mg via INTRAVENOUS
  Filled 2020-08-31 (×9): qty 1

## 2020-08-31 NOTE — Progress Notes (Signed)
PROGRESS NOTE    [name]   VHQ:[id number]  DOB: 1985/11/11  PCP: Pcp, No    DOA: 08/30/2020 LOS: 1   Brief Narrative    Dominique Bennett is a 35 y.o. female G8 P0-4-3-4 currently [redacted] weeks pregnant by dates with a known history of anxiety, asthma, bipolar disorder, carpal tunnel syndrome, chronic back pain, depression, IBS, migraines, PTSD, sleep disorder and history of substance use disorder presents to the emergency department for evaluation of left flank pain, dysuria and nausea.  History of recurrent kidney stones for many years.    Of note, at time of admission, denied any lower abdominal cramping, discharge, or vaginal bleeding.  Reported recently establishing her obstetric care with [facility], Dr. Logan Bores.  Admitted for management of acute pyelonephritis.   On empiric Rocephin pending urine culture.  OB is consulted given U/S in ED concerning for spontaneous abortion.  Pt 10 weeks by dates, fetal size 6 weeks on U/S, and fetal heart tones identified.  Pt reports this would be her 4th loss of pregnancy.    Assessment & Plan   Active Problems:   Pyelonephritis   Pyelonephritis - Continue empiric Rocephin.  Follow urine cultures.  Supportive care - pain control, antiemetics, IV fluids.  Strain all urine.  ?Spontaneous abortion with History of recurrent spontaneous abortion - as outlined above.  OB is consulted.  Dr. Valentino Saxon reportedly contacted by admitting MD.  Check Lupus anticoagulant panel.  Hx of polysubstance abuse - appears EtOh and UDS not collected.  Counseling provided on importance of cessation of substance use.  TOC consulted for resources.    Hx of Anxiety, Depression, PTSD, Bipolar disorder - seems stable.  Continue home meds: Prozac, Seroquel, Trazodone.   Noted in chart, OB recommended weaning down off all of these meds but pt feels they are required and helpful in avoiding substance use.  Obesity: Body mass index is 30.67 kg/m.  Complicates overall care  and prognosis.  Recommend lifestyle modifications including physical activity and diet for weight loss and overall long-term health.   DVT prophylaxis: SCDs Start: 08/30/20 2125   Diet:  Diet Orders (From admission, onward)    Start     Ordered   08/30/20 2125  Diet regular Room service appropriate? Yes; Fluid consistency: Thin  Diet effective now       Question Answer Comment  Room service appropriate? Yes   Fluid consistency: Thin      08/30/20 2124            Code Status: Full Code    Subjective 08/31/20    Pt reports having some lower abdominal cramping this AM.  No vaginal bleeding.  Continues to have left flank pain and dysuria.  No fever or chills.     Disposition Plan & Communication   Status is: Inpatient  Inpatient status appropriate due to severity of illness on IV therapies as above, ongoing evaluation  Dispo: The patient is from: home              Anticipated d/c is to: home              Patient currently not medically stable for dc.   Difficult to place patient no    Consults, Procedures, Significant Events   Consultants:   Obstetrics  Procedures:   None  Antimicrobials:  Anti-infectives (From admission, onward)   Start     Dose/Rate Route Frequency Ordered Stop   08/31/20 1800  cefTRIAXone (ROCEPHIN) 1 g in sodium  chloride 0.9 % 100 mL IVPB        1 g 200 mL/hr over 30 Minutes Intravenous Every 24 hours 08/30/20 2124     08/30/20 1845  cefTRIAXone (ROCEPHIN) 1 g in sodium chloride 0.9 % 100 mL IVPB        1 g 200 mL/hr over 30 Minutes Intravenous  Once 08/30/20 1833 08/30/20 1951        Micro    Objective   Vitals:   08/30/20 2030 08/30/20 2125 08/31/20 0528 08/31/20 0727  BP: 107/68 116/63 (!) 105/50 (!) 97/40  Pulse: 60 (!) 58 (!) 56 (!) 54  Resp: 16 17 17 16   Temp:  97.6 F (36.4 C) (!) 97.5 F (36.4 C) 98.4 F (36.9 C)  TempSrc:  Oral Oral   SpO2: 97% 99% 99% 100%  Weight:      Height:        Intake/Output Summary  (Last 24 hours) at 08/31/2020 1344 Last data filed at 08/31/2020 1023 Gross per 24 hour  Intake 1120 ml  Output --  Net 1120 ml   Filed Weights   08/30/20 1349 08/30/20 1355  Weight: 86.2 kg 86.2 kg    Physical Exam:  General exam: awake, alert, no acute distress HEENT: moist mucus membranes, hearing grossly normal  Respiratory system: CTAB, no wheezes, rales or rhonchi, normal respiratory effort. Cardiovascular system: normal S1/S2, RRR, no JVD, murmurs, rubs, gallops, no pedal edema.   Gastrointestinal system: soft, NT, ND, no HSM felt, +bowel sounds.  L CVA tenderness Central nervous system: A&O x4. no gross focal neurologic deficits, normal speech Extremities: moves all, no edema, normal tone Psychiatry: normal mood, flat affect  Labs   Data Reviewed: I have personally reviewed following labs and imaging studies  CBC: Recent Labs  Lab 08/30/20 1400 08/31/20 0512  WBC 10.2 7.6  HGB 13.4 12.0  HCT 39.3 34.8*  MCV 99.2 98.9  PLT 290 244   Basic Metabolic Panel: Recent Labs  Lab 08/30/20 1400 08/31/20 0512  NA 134* 136  K 3.3* 3.6  CL 107 110  CO2 20* 20*  GLUCOSE 97 89  BUN 8 8  CREATININE 0.66 0.56  CALCIUM 8.7* 8.5*   GFR: Estimated Creatinine Clearance: 108.6 mL/min (by C-G formula based on SCr of 0.56 mg/dL). Liver Function Tests: Recent Labs  Lab 08/31/20 0512  AST 13*  ALT 12  ALKPHOS 45  BILITOT 0.5  PROT 6.0*  ALBUMIN 3.2*   No results for input(s): LIPASE, AMYLASE in the last 168 hours. No results for input(s): AMMONIA in the last 168 hours. Coagulation Profile: No results for input(s): INR, PROTIME in the last 168 hours. Cardiac Enzymes: No results for input(s): CKTOTAL, CKMB, CKMBINDEX, TROPONINI in the last 168 hours. BNP (last 3 results) No results for input(s): PROBNP in the last 8760 hours. HbA1C: No results for input(s): HGBA1C in the last 72 hours. CBG: No results for input(s): GLUCAP in the last 168 hours. Lipid Profile: No  results for input(s): CHOL, HDL, LDLCALC, TRIG, CHOLHDL, LDLDIRECT in the last 72 hours. Thyroid Function Tests: No results for input(s): TSH, T4TOTAL, FREET4, T3FREE, THYROIDAB in the last 72 hours. Anemia Panel: No results for input(s): VITAMINB12, FOLATE, FERRITIN, TIBC, IRON, RETICCTPCT in the last 72 hours. Sepsis Labs: No results for input(s): PROCALCITON, LATICACIDVEN in the last 168 hours.  Recent Results (from the past 240 hour(s))  SARS CORONAVIRUS 2 (TAT 6-24 HRS) Nasopharyngeal Nasopharyngeal Swab     Status: None  Collection Time: 08/30/20  7:53 PM   Specimen: Nasopharyngeal Swab  Result Value Ref Range Status   SARS Coronavirus 2 NEGATIVE NEGATIVE Final    Comment: (NOTE) SARS-CoV-2 target nucleic acids are NOT DETECTED.  The SARS-CoV-2 RNA is generally detectable in upper and lower respiratory specimens during the acute phase of infection. Negative results do not preclude SARS-CoV-2 infection, do not rule out co-infections with other pathogens, and should not be used as the sole basis for treatment or other patient management decisions. Negative results must be combined with clinical observations, patient history, and epidemiological information. The expected result is Negative.  Fact Sheet for Patients: HairSlick.no  Fact Sheet for Healthcare Providers: quierodirigir.com  This test is not yet approved or cleared by the [place] FDA and  has been authorized for detection and/or diagnosis of SARS-CoV-2 by FDA under an Emergency Use Authorization (EUA). This EUA will remain  in effect (meaning this test can be used) for the duration of the COVID-19 declaration under Se ction 564(b)(1) of the Act, 21 U.S.C. section 360bbb-3(b)(1), unless the authorization is terminated or revoked sooner.  Performed at [facility], [address], [place], [place] [address]       Imaging Studies   US  Renal  Result Date: 08/30/2020 CLINICAL DATA:  Flank pain EXAM: RENAL / URINARY TRACT ULTRASOUND COMPLETE COMPARISON:  CT abdomen 02/16/2014 FINDINGS: Right Kidney: Renal measurements: 11 x 6.8 x 5 cm = volume: 195.6 mL. Echogenicity within normal limits. No mass or hydronephrosis visualized. Left Kidney: Renal measurements: 11 x 5.6 x 5.1 cm = volume: 166.2 mL. Echogenicity within normal limits. No mass or hydronephrosis visualized. Bladder: Appears normal for degree of bladder distention. Other: None. IMPRESSION: 1. Normal renal ultrasound. Electronically Signed   By: Elige Ko   On: 08/30/2020 19:11   US OB LESS THAN 14 WEEKS WITH OB TRANSVAGINAL  Result Date: 08/30/2020 CLINICAL DATA:  Initial evaluation for acute left adnexal pain for 1 day. Early pregnancy. EXAM: OBSTETRIC <14 WK [place] AND TRANSVAGINAL OB US TECHNIQUE: Both transabdominal and transvaginal ultrasound examinations were performed for complete evaluation of the gestation as well as the maternal uterus, adnexal regions, and pelvic cul-de-sac. Transvaginal technique was performed to assess early pregnancy. COMPARISON:  None available. FINDINGS: Intrauterine gestational sac: Single Yolk sac:  Negative. Embryo:  Present. Cardiac Activity: Negative. Heart Rate: N/A CRL: 3.5 mm   6 w   0 d                  [place] EDC: 04/25/2021 Subchorionic hemorrhage: Small subchorionic hemorrhage without mass effect. Maternal uterus/adnexae: Ovaries within normal limits bilaterally. 1.8 cm degenerating corpus luteal cyst noted within the left ovary. No adnexal mass. Small volume free fluid present within the pelvis. IMPRESSION: 1. Single intrauterine pregnancy with internal embryo, but no detectable cardiac activity. Crown-rump length measures 3.5 mm. Findings are suspicious but not yet definitive for failed pregnancy. Recommend follow-up US in 10-14 days for definitive diagnosis. This recommendation follows SRU consensus guidelines: Diagnostic Criteria for  Nonviable Pregnancy Early in the First Trimester. Malva Limes Med 2013; 973:5329-92. 2. Small subchorionic hemorrhage without mass effect. 3. 1.8 cm degenerating left ovarian corpus luteal cyst with small volume free fluid in the pelvis. Finding could contribute to acute left adnexal pain. Electronically Signed   By: Rise Mu M.D.   On: 08/30/2020 19:36     Medications   Scheduled Meds: Continuous Infusions: . sodium chloride 75 mL/hr at 08/31/20  1131  . cefTRIAXone (ROCEPHIN)  IV         LOS: 1 day    Time spent: 30 minutes    Pennie Banter, DO [facility]  08/31/2020, 1:44 PM      If 7PM-7AM, please contact night-coverage. How to contact the [facility] Attending or Consulting provider 7A - 7P or covering provider during after hours 7P -7A, for this patient?    1. Check the care team in [facility] and look for a) attending/consulting TR[facility] provider listed and b) the [facility] team listed 2. Log into [email or web address] and use [place]'s universal password to access. If you do not have the password, please contact the hospital operator. 3. Locate the [facility] provider you are looking for under [facility] and page to a number that you can be directly reached. 4. If you still have difficulty reaching the provider, please page the [facility] (Director on Call) for the Hospitalists listed on amion for assistance.

## 2020-08-31 NOTE — Consult Note (Signed)
Reason for Consult: Pregnancy of uncertain viability Referring Physician: Esaw Grandchild, DO  Dominique Bennett is an 35 y.o. [id number] female who was admitted yesterday evening for pyelonephritis after presentation to the Emergency Room with increasing left flank pain and some dysuria. Per her menstrual cycle of 06/22/2020, she be approximately [redacted] weeks pregnant, however patient did note that her cycles had been irregular. Recent ultrasound performed at admission reported a gestation at [redacted] weeks gestation, but no FCA detected.   Pertinent Gynecological History: Menses: reports irregular cycles Contraception: none Sexually transmitted diseases: no past history Previous GYN Procedures: None     OB History  Gravida Para Term Preterm AB Living  8 4   4 3 4   SAB IAB Ectopic Multiple Live Births  3       4    # Outcome Date GA Lbr Len/2nd Weight Sex Delivery Anes PTL Lv  8 Current           7 SAB 12/2019          6 Preterm 10/20/12        LIV  5 Preterm 08/24/08    M    LIV  4 Preterm 04/21/06    M    LIV  3 Preterm 03/20/05    F    LIV  2 SAB           1 SAB               Past Medical History:  Diagnosis Date  . Anxiety   . Asthma   . Bipolar 1 disorder (HCC)   . Carpal tunnel syndrome   . Chronic back pain   . Depression   . IBS (irritable bowel syndrome)   . Migraines   . PTSD (post-traumatic stress disorder)   . Sleep disorder   . Substance use disorder     Past Surgical History:  Procedure Laterality Date  . CESAREAN SECTION    . LITHOTRIPSY    . TONSILLECTOMY      No family history on file.  Social History:  reports that she has been smoking cigarettes. She has been smoking about 0.50 packs per day. She has never used smokeless tobacco. She reports previous alcohol use. She reports previous drug use. Drug: "Crack" cocaine.  Allergies:  Allergies  Allergen Reactions  . Duloxetine Other (See Comments)    Suicidal Patient states it causes her to think about  suicide   . Egg Shells Anaphylaxis  . Ibuprofen Other (See Comments)    "kidneys start to shut down"  . Tramadol Other (See Comments)    Paralysis of bladder  . Fluoxetine Rash  . Cymbalta [Duloxetine Hcl] Other (See Comments)    Altered mental status  . Other Hives    Flu vaccine  . Latex Rash  . Nickel Itching and Rash    Medications:  Prior to Admission:  Medications Prior to Admission  Medication Sig Dispense Refill Last Dose  . busPIRone (BUSPAR) 15 MG tablet TAKE ONE TABLET BY MOUTH 3 TIMES A DAY 90 tablet 0 08/29/2020 at Unknown time  . docusate sodium (COLACE) 100 MG capsule Take 1 capsule (100 mg total) by mouth 2 (two) times daily. 60 capsule 0 08/29/2020 at Unknown time  . FLUoxetine (PROZAC) 40 MG capsule TAKE ONE CAPSULE BY MOUTH EVERY DAY 30 capsule 0 08/29/2020 at Unknown time  . Prenatal Vit-Fe Fumarate-FA (MULTIVITAMIN-PRENATAL) 27-0.8 MG TABS tablet Take 1 tablet by mouth daily at 12 noon. 100  tablet 0 08/29/2020 at Unknown time  . QUEtiapine (SEROQUEL) 100 MG tablet TAKE 1 AND 1/2 TABLETS (150 MG) BY MOUTH EVERY MORNING AND TAKE 4.5 TABLETS (450 MG) BY MOUTHAT BEDTIME 180 tablet 0 08/29/2020 at Unknown time  . traZODone (DESYREL) 100 MG tablet TAKE ONE TABLET BY MOUTH AT BEDTIME AS NEEDED FOR SLEEP (Patient taking differently: Take 100 mg by mouth at bedtime as needed. for sleep) 30 tablet 0 08/29/2020 at Unknown time    Review of Systems  Constitutional: Negative for chills and fever.  HENT: Negative.   Eyes: Negative.   Respiratory: Negative.   Cardiovascular: Negative.   Gastrointestinal: Negative.   Endocrine: Negative.   Genitourinary: Positive for dysuria and flank pain. Negative for pelvic pain, vaginal bleeding and vaginal discharge.       Pelvic cramping  Skin: Negative.   Allergic/Immunologic: Negative.   Neurological: Negative.     Blood pressure (!) 107/43, pulse (!) 58, temperature 98 F (36.7 C), resp. rate 18, height 5\' 6"  (1.676 m), weight 86.2  kg, last menstrual period 06/22/2020, SpO2 99 %.  Body mass index is 30.67 kg/m.  Physical Exam Constitutional:      General: She is not in acute distress.    Appearance: Normal appearance.  HENT:     Head: Normocephalic and atraumatic.     Nose: Nose normal.  Eyes:     Extraocular Movements: Extraocular movements intact.     Pupils: Pupils are equal, round, and reactive to light.  Cardiovascular:     Rate and Rhythm: Normal rate and regular rhythm.     Pulses: Normal pulses.     Heart sounds: Normal heart sounds.  Pulmonary:     Effort: Pulmonary effort is normal.  Abdominal:     General: Abdomen is flat. Bowel sounds are normal. There is no distension.     Palpations: Abdomen is soft. There is no mass.     Tenderness: There is no abdominal tenderness.  Genitourinary:    General: Normal vulva.     Rectum: Normal. Guaiac result negative.  Musculoskeletal:        General: Normal range of motion.     Cervical back: Normal range of motion and neck supple. No tenderness.  Skin:    General: Skin is warm.  Neurological:     General: No focal deficit present.     Mental Status: She is alert and oriented to person, place, and time.  Psychiatric:        Mood and Affect: Mood normal.        Behavior: Behavior normal.     Results for orders placed or performed during the hospital encounter of 08/30/20 (from the past 48 hour(s))  Urinalysis, Complete w Microscopic Urine, Clean Catch     Status: Abnormal   Collection Time: 08/30/20  2:00 PM  Result Value Ref Range   Color, Urine YELLOW (A) YELLOW   APPearance CLOUDY (A) CLEAR   Specific Gravity, Urine 1.021 1.005 - 1.030   pH 7.0 5.0 - 8.0   Glucose, UA NEGATIVE NEGATIVE mg/dL   Hgb urine dipstick MODERATE (A) NEGATIVE   Bilirubin Urine NEGATIVE NEGATIVE   Ketones, ur NEGATIVE NEGATIVE mg/dL   Protein, ur 30 (A) NEGATIVE mg/dL   Nitrite NEGATIVE NEGATIVE   Leukocytes,Ua MODERATE (A) NEGATIVE   RBC / HPF >50 (H) 0 - 5 RBC/hpf    WBC, UA >50 (H) 0 - 5 WBC/hpf   Bacteria, UA FEW (A) NONE SEEN  Squamous Epithelial / LPF 21-50 0 - 5   WBC Clumps PRESENT    Mucus PRESENT     Comment: Performed at [facility], [address] [place], [place] [address]  CBC     Status: None   Collection Time: 08/30/20  2:00 PM  Result Value Ref Range   WBC 10.2 4.0 - 10.5 K/uL   RBC 3.96 3.87 - 5.11 MIL/uL   Hemoglobin 13.4 12.0 - 15.0 g/dL   HCT 91.4 78.2 - 95.6 %   MCV 99.2 80.0 - 100.0 fL   MCH 33.8 26.0 - 34.0 pg   MCHC 34.1 30.0 - 36.0 g/dL   RDW 21.3 08.6 - 57.8 %   Platelets 290 150 - 400 K/uL   nRBC 0.0 0.0 - 0.2 %    Comment: Performed at [facility], [address] [place], [place] [address]  Basic metabolic panel     Status: Abnormal   Collection Time: 08/30/20  2:00 PM  Result Value Ref Range   Sodium 134 (L) 135 - 145 mmol/L   Potassium 3.3 (L) 3.5 - 5.1 mmol/L   Chloride 107 98 - 111 mmol/L   CO2 20 (L) 22 - 32 mmol/L   Glucose, Bld 97 70 - 99 mg/dL    Comment: Glucose reference range applies only to samples taken after fasting for at least 8 hours.   BUN 8 6 - 20 mg/dL   Creatinine, Ser 9.52 0.44 - 1.00 mg/dL   Calcium 8.7 (L) 8.9 - 10.3 mg/dL   GFR, Estimated >84 >13 mL/min    Comment: (NOTE) Calculated using the CKD-EPI Creatinine Equation (2021)    Anion gap 7 5 - 15    Comment: Performed at [facility], [address] [place], [place] [address]  Urinalysis, Complete w Microscopic Urine, Clean Catch     Status: Abnormal   Collection Time: 08/30/20  5:50 PM  Result Value Ref Range   Color, Urine YELLOW (A) YELLOW   APPearance HAZY (A) CLEAR   Specific Gravity, Urine 1.020 1.005 - 1.030   pH 6.0 5.0 - 8.0   Glucose, UA NEGATIVE NEGATIVE mg/dL   Hgb urine dipstick MODERATE (A) NEGATIVE   Bilirubin Urine NEGATIVE NEGATIVE   Ketones, ur 20 (A) NEGATIVE mg/dL   Protein, ur NEGATIVE NEGATIVE mg/dL   Nitrite NEGATIVE NEGATIVE   Leukocytes,Ua MODERATE (A) NEGATIVE    RBC / HPF >50 (H) 0 - 5 RBC/hpf   WBC, UA >50 (H) 0 - 5 WBC/hpf   Bacteria, UA RARE (A) NONE SEEN   Squamous Epithelial / LPF 0-5 0 - 5   Mucus PRESENT    Hyaline Casts, UA PRESENT     Comment: Performed at [facility], [address] [place], [place] [address]  hCG, quantitative, pregnancy     Status: Abnormal   Collection Time: 08/30/20  5:50 PM  Result Value Ref Range   hCG, Beta Chain, Quant, S 17,270 (H) <5 mIU/mL    Comment:          GEST. AGE      CONC.  (mIU/mL)   <=1 WEEK        5 - 50     2 WEEKS       50 - 500     3 WEEKS       100 - 10,000     4 WEEKS     1,000 - 30,000     5 WEEKS  3,500 - 115,000   6-8 WEEKS     12,000 - 270,000    12 WEEKS     15,000 - 220,000        FEMALE AND NON-PREGNANT FEMALE:     LESS THAN 5 mIU/mL Performed at [facility], [address] [place], [place] [address]   SARS CORONAVIRUS 2 (TAT 6-24 HRS) Nasopharyngeal Nasopharyngeal Swab     Status: None   Collection Time: 08/30/20  7:53 PM   Specimen: Nasopharyngeal Swab  Result Value Ref Range   SARS Coronavirus 2 NEGATIVE NEGATIVE    Comment: (NOTE) SARS-CoV-2 target nucleic acids are NOT DETECTED.  The SARS-CoV-2 RNA is generally detectable in upper and lower respiratory specimens during the acute phase of infection. Negative results do not preclude SARS-CoV-2 infection, do not rule out co-infections with other pathogens, and should not be used as the sole basis for treatment or other patient management decisions. Negative results must be combined with clinical observations, patient history, and epidemiological information. The expected result is Negative.  Fact Sheet for Patients: HairSlick.nohttps://www.fda.gov/media/138098/download  Fact Sheet for Healthcare Providers: quierodirigir.comhttps://www.fda.gov/media/138095/download  This test is not yet approved or cleared by the [place] FDA and  has been authorized for detection and/or diagnosis of SARS-CoV-2 by FDA under an  Emergency Use Authorization (EUA). This EUA will remain  in effect (meaning this test can be used) for the duration of the COVID-19 declaration under Se ction 564(b)(1) of the Act, 21 U.S.C. section 360bbb-3(b)(1), unless the authorization is terminated or revoked sooner.  Performed at [facility], [address] [place], [place] [address]   Comprehensive metabolic panel     Status: Abnormal   Collection Time: 08/31/20  5:12 AM  Result Value Ref Range   Sodium 136 135 - 145 mmol/L   Potassium 3.6 3.5 - 5.1 mmol/L   Chloride 110 98 - 111 mmol/L   CO2 20 (L) 22 - 32 mmol/L   Glucose, Bld 89 70 - 99 mg/dL    Comment: Glucose reference range applies only to samples taken after fasting for at least 8 hours.   BUN 8 6 - 20 mg/dL   Creatinine, Ser 8.290.56 0.44 - 1.00 mg/dL   Calcium 8.5 (L) 8.9 - 10.3 mg/dL   Total Protein 6.0 (L) 6.5 - 8.1 g/dL   Albumin 3.2 (L) 3.5 - 5.0 g/dL   AST 13 (L) 15 - 41 U/L   ALT 12 0 - 44 U/L   Alkaline Phosphatase 45 38 - 126 U/L   Total Bilirubin 0.5 0.3 - 1.2 mg/dL   GFR, Estimated >56>60 >21>60 mL/min    Comment: (NOTE) Calculated using the CKD-EPI Creatinine Equation (2021)    Anion gap 6 5 - 15    Comment: Performed at [facility], [address] [place], [place] [address]  CBC     Status: Abnormal   Collection Time: 08/31/20  5:12 AM  Result Value Ref Range   WBC 7.6 4.0 - 10.5 K/uL   RBC 3.52 (L) 3.87 - 5.11 MIL/uL   Hemoglobin 12.0 12.0 - 15.0 g/dL   HCT 78.434.8 (L) 69.636.0 - 29.546.0 %   MCV 98.9 80.0 - 100.0 fL   MCH 34.1 (H) 26.0 - 34.0 pg   MCHC 34.5 30.0 - 36.0 g/dL   RDW 28.412.8 13.211.5 - 44.015.5 %   Platelets 244 150 - 400 K/uL   nRBC 0.0 0.0 - 0.2 %    Comment: Performed at [facility], [address] [place], [place] [address]    US Renal  Result Date: 08/30/2020 CLINICAL DATA:  Flank pain EXAM: RENAL / URINARY TRACT ULTRASOUND COMPLETE COMPARISON:  CT abdomen 02/16/2014 FINDINGS: Right Kidney: Renal measurements: 11 x  6.8 x 5 cm = volume: 195.6 mL. Echogenicity within normal limits. No mass or hydronephrosis visualized. Left Kidney: Renal measurements: 11 x 5.6 x 5.1 cm = volume: 166.2 mL. Echogenicity within normal limits. No mass or hydronephrosis visualized. Bladder: Appears normal for degree of bladder distention. Other: None. IMPRESSION: 1. Normal renal ultrasound. Electronically Signed   By: Elige Ko   On: 08/30/2020 19:11   US OB LESS THAN 14 WEEKS WITH OB TRANSVAGINAL  Result Date: 08/30/2020 CLINICAL DATA:  Initial evaluation for acute left adnexal pain for 1 day. Early pregnancy. EXAM: OBSTETRIC <14 WK [place] AND TRANSVAGINAL OB US TECHNIQUE: Both transabdominal and transvaginal ultrasound examinations were performed for complete evaluation of the gestation as well as the maternal uterus, adnexal regions, and pelvic cul-de-sac. Transvaginal technique was performed to assess early pregnancy. COMPARISON:  None available. FINDINGS: Intrauterine gestational sac: Single Yolk sac:  Negative. Embryo:  Present. Cardiac Activity: Negative. Heart Rate: N/A CRL: 3.5 mm   6 w   0 d                  [place] EDC: 04/25/2021 Subchorionic hemorrhage: Small subchorionic hemorrhage without mass effect. Maternal uterus/adnexae: Ovaries within normal limits bilaterally. 1.8 cm degenerating corpus luteal cyst noted within the left ovary. No adnexal mass. Small volume free fluid present within the pelvis. IMPRESSION: 1. Single intrauterine pregnancy with internal embryo, but no detectable cardiac activity. Crown-rump length measures 3.5 mm. Findings are suspicious but not yet definitive for failed pregnancy. Recommend follow-up US in 10-14 days for definitive diagnosis. This recommendation follows SRU consensus guidelines: Diagnostic Criteria for Nonviable Pregnancy Early in the First Trimester. Malva Limes Med 2013; 413:2440-10. 2. Small subchorionic hemorrhage without mass effect. 3. 1.8 cm degenerating left ovarian corpus luteal cyst with  small volume free fluid in the pelvis. Finding could contribute to acute left adnexal pain. Electronically Signed   By: Rise Mu M.D.   On: 08/30/2020 19:36    Assessment/Plan: 1. Pregnancy of undetermined viability  - Per ultrasound, gestational age not matching dates by LMP (6 weeks vs 10 weeks). Advised that this can happen in light of her history of irregular or abnormal cycles. Also discussed that it is possible that the pregnancy has actually failed. BHCG was obtained yesterday. As patient will still be inpatient tomorrow, will follow hCG trend to give patient more definitive answer.   2. Pyelonephritis  - Currently receiving IV pain medication (Ceftriaxone q 24 hrs).  Also receiving IV pain medication (Morphine).  - If pregnancy is viable, will need to continue prophylactic antibiotics during preg  3. H/o bipolar disorder, depression - Currently on several medications (mainly Category C), where she can continue as benefits outweigh risks at this time (currently also helping her not to relapse to illicit substance use). Has only been on these medications for a little over a month.    Hildred Laser, MD [facility] 08/31/2020

## 2020-09-01 DIAGNOSIS — K581 Irritable bowel syndrome with constipation: Secondary | ICD-10-CM

## 2020-09-01 DIAGNOSIS — O021 Missed abortion: Secondary | ICD-10-CM

## 2020-09-01 LAB — HCG, QUANTITATIVE, PREGNANCY: hCG, Beta Chain, Quant, S: 12302 m[IU]/mL — ABNORMAL HIGH (ref <5)

## 2020-09-01 LAB — URINE CULTURE: Culture: 50000 — AB

## 2020-09-01 MED ORDER — DIPHENHYDRAMINE HCL 25 MG PO CAPS
25.0000 mg | ORAL_CAPSULE | Freq: Four times a day (QID) | ORAL | Status: DC | PRN
  Administered 2020-09-01 – 2020-09-02 (×2): 25 mg via ORAL
  Filled 2020-09-01 (×2): qty 1

## 2020-09-01 MED ORDER — QUETIAPINE FUMARATE 25 MG PO TABS
450.0000 mg | ORAL_TABLET | Freq: Every day | ORAL | Status: DC
  Administered 2020-09-01 – 2020-09-02 (×2): 450 mg via ORAL
  Filled 2020-09-01 (×3): qty 2

## 2020-09-01 MED ORDER — MORPHINE SULFATE (PF) 2 MG/ML IV SOLN
2.0000 mg | INTRAVENOUS | Status: DC | PRN
Start: 2020-09-01 — End: 2020-09-03
  Administered 2020-09-02 (×3): 2 mg via INTRAVENOUS
  Filled 2020-09-01 (×3): qty 1

## 2020-09-01 MED ORDER — BUSPIRONE HCL 10 MG PO TABS
15.0000 mg | ORAL_TABLET | Freq: Three times a day (TID) | ORAL | Status: DC
  Administered 2020-09-01 – 2020-09-03 (×5): 15 mg via ORAL
  Filled 2020-09-01: qty 2
  Filled 2020-09-01 (×4): qty 1.5
  Filled 2020-09-01: qty 2

## 2020-09-01 MED ORDER — OXYCODONE-ACETAMINOPHEN 5-325 MG PO TABS
1.0000 | ORAL_TABLET | ORAL | Status: DC | PRN
  Administered 2020-09-01 (×3): 1 via ORAL
  Administered 2020-09-02 (×3): 2 via ORAL
  Administered 2020-09-02: 1 via ORAL
  Administered 2020-09-03: 2 via ORAL
  Administered 2020-09-03 (×2): 1 via ORAL
  Administered 2020-09-03: 2 via ORAL
  Filled 2020-09-01: qty 1
  Filled 2020-09-01 (×3): qty 2
  Filled 2020-09-01 (×4): qty 1
  Filled 2020-09-01: qty 2
  Filled 2020-09-01: qty 1
  Filled 2020-09-01: qty 2

## 2020-09-01 MED ORDER — QUETIAPINE FUMARATE 25 MG PO TABS
150.0000 mg | ORAL_TABLET | Freq: Every day | ORAL | Status: DC
  Administered 2020-09-03: 150 mg via ORAL
  Filled 2020-09-01: qty 6

## 2020-09-01 MED ORDER — FLUOXETINE HCL 20 MG PO CAPS
40.0000 mg | ORAL_CAPSULE | Freq: Every day | ORAL | Status: DC
  Administered 2020-09-01 – 2020-09-03 (×2): 40 mg via ORAL
  Filled 2020-09-01 (×3): qty 2

## 2020-09-01 MED ORDER — COVID-19 MRNA VACC (MODERNA) 100 MCG/0.5ML IM SUSP
0.5000 mL | Freq: Once | INTRAMUSCULAR | Status: AC
  Administered 2020-09-02: 0.5 mL via INTRAMUSCULAR
  Filled 2020-09-01 (×2): qty 0.5

## 2020-09-01 MED ORDER — DOCUSATE SODIUM 100 MG PO CAPS
100.0000 mg | ORAL_CAPSULE | Freq: Two times a day (BID) | ORAL | Status: DC
  Administered 2020-09-01: 100 mg via ORAL
  Filled 2020-09-01: qty 1

## 2020-09-01 MED ORDER — TRAZODONE HCL 100 MG PO TABS
100.0000 mg | ORAL_TABLET | Freq: Every evening | ORAL | Status: DC | PRN
  Administered 2020-09-01 – 2020-09-02 (×3): 100 mg via ORAL
  Filled 2020-09-01 (×2): qty 1

## 2020-09-01 NOTE — Progress Notes (Signed)
Hospital Day # 2, admitted for pyelonephritis.  Currently pregnant. Also with a h/o bipolar disorder, depression, PTSD, asthma, history of substance use, IBS-C. Also with a h/o recurrent kidney stones.   Subjective: patient still reports complaints of pain in her back.  Denies nausea, vomiting, fevers, or chills. Does note that she has IBS and does not use the restroom regularly (q 2-4 days). Had last BM 3 days ago.   Objective: Temp:  [98 F (36.7 C)-98.4 F (36.9 C)] 98.4 F (36.9 C) (05/01 0813) Pulse Rate:  [57-60] 58 (05/01 0813) Resp:  [16-18] 16 (05/01 0813) BP: (97-107)/(43-52) 105/52 (05/01 0813) SpO2:  [99 %-100 %] 99 % (05/01 0813)  Physical Exam:  General: alert and no distress  Lungs: clear to auscultation bilaterally Heart: regular rate and rhythm, S1, S2 normal, no murmur, click, rub or gallop Abdomen: soft, non-tender; bowel sounds normal; no masses,  no organomegaly Pelvis:Bleeding: None Extremities: DVT Evaluation: No evidence of DVT seen on physical exam. Negative Homan's sign.  No cords or calf tenderness. No significant calf/ankle edema.  SCDs on.     CBC Latest Ref Rng & Units 08/31/2020 08/30/2020  WBC 4.0 - 10.5 K/uL 7.6 10.2  Hemoglobin 12.0 - 15.0 g/dL 62.8 31.5  Hematocrit 17.6 - 46.0 % 34.8(L) 39.3  Platelets 150 - 400 K/uL 244 290     CMP Latest Ref Rng & Units 08/31/2020 08/30/2020 07/31/2020  Glucose 70 - 99 mg/dL 89 97 84  BUN 6 - 20 mg/dL 8 8 6   Creatinine 0.44 - 1.00 mg/dL 1.60 7.37  Sodium 135 - 145 mmol/L 136 134(L) 136  Potassium 3.5 - 5.1 mmol/L 3.6 3.3(L) 4.0  Chloride 98 - 111 mmol/L 110 107 101  CO2 22 - 32 mmol/L 20(L) 20(L) 19(L)  Calcium 8.9 - 10.3 mg/dL 1.06) 2.6(R) 9.4  Total Protein 6.5 - 8.1 g/dL 6.0(L) - 7.2  Total Bilirubin 0.3 - 1.2 mg/dL 0.5 - 4.8(N  Alkaline Phos 38 - 126 U/L 45 - 81  AST 15 - 41 U/L 13(L) - 18  ALT 0 - 44 U/L 12 - 13    Results for NIYLA, MARONE (MRN Lianne Cure) as of 09/01/2020 15:31  Ref. Range  08/30/2020 17:50 08/31/2020 05:12 09/01/2020 11:52  HCG, Beta Chain, Quant, S Latest Ref Range: <5 mIU/mL 17,270 (H)  12,302 (H)    Assessment/Plan: 1. Pyelonephritis - Currently receiving IV pain medication (Ceftriaxone q 24 hrs).  Also receiving IV pain medication (Morphine). Still noting some pain. Can continue IV pain meds, but be cautious of overuse due to patient's history of substance use and her IBS-C.  2. Pregnancy of undetermined viability - Patient was unsure of her dates, recent ultrasound noted IUP at [redacted] weeks gestation with no fetal heart tones, unsure if this was early gestation, vs failed pregnanncy.  Ordered BHCG levels to assess trend. Today confirmed failed pregnancy by labs.  Discussed results with patient.  Briefly reviewed options for management but did not go into significant detail as patient was disturbed by recent details.  Can discuss further on a subsequent day.   3. H/o bipolar disorder, notes that her meds were not restarted since she has been inpatient. Will order.   4. H/o IBS, will ensure that patient is on bowel regimen while inpatient.      11/01/2020, MD [facility]

## 2020-09-01 NOTE — Progress Notes (Signed)
PROGRESS NOTE    Dominique Bennett   GUR:[id number]  DOB: 1985-08-03  PCP: Pcp, No    DOA: 08/30/2020 LOS: 2   Brief Narrative    Dominique Bennett is a 35 y.o. female G8 P0-4-3-4 currently [redacted] weeks pregnant by dates with a known history of anxiety, asthma, bipolar disorder, carpal tunnel syndrome, chronic back pain, depression, IBS, migraines, PTSD, sleep disorder and history of substance use disorder presents to the emergency department for evaluation of left flank pain, dysuria and nausea.  History of recurrent kidney stones for many years.    Of note, at time of admission, denied any lower abdominal cramping, discharge, or vaginal bleeding.  Reported recently establishing her obstetric care with [facility], Dr. Logan Bores.  Admitted for management of acute pyelonephritis.   On empiric Rocephin pending urine culture.  OB is consulted given U/S in ED concerning for spontaneous abortion.  Pt 10 weeks by dates, fetal size 6 weeks on U/S, and fetal heart tones identified.  Pt reports this would be her 4th loss of pregnancy.    Assessment & Plan   Active Problems:   Pyelonephritis   Pyelonephritis - Continue empiric Rocephin.  Follow urine cultures.  Supportive care - pain control - PO meds with IV for breakthrough only, antiemetics, IV fluids.  Strain all urine.  ?Spontaneous abortion with History of recurrent spontaneous abortion - as outlined above.  OB is consulted.  Dr. Valentino Saxon reportedly contacted by admitting MD.  Check Lupus anticoagulant panel.  Per OB, trend bHCG while here.  Hx of polysubstance abuse - appears EtOh and UDS not collected.  Counseling provided on importance of cessation of substance use.  TOC consulted for resources.    Hx of Anxiety, Depression, PTSD, Bipolar disorder - seems stable.  Continue home meds: Prozac, Seroquel, Trazodone.   Noted in chart, OB recommended weaning down off all of these meds but pt feels they are required and helpful in avoiding substance  use.  Obesity: Body mass index is 30.67 kg/m.  Complicates overall care and prognosis.  Recommend lifestyle modifications including physical activity and diet for weight loss and overall long-term health.   DVT prophylaxis: SCDs Start: 08/30/20 2125   Diet:  Diet Orders (From admission, onward)    Start     Ordered   08/30/20 2125  Diet regular Room service appropriate? Yes; Fluid consistency: Thin  Diet effective now       Question Answer Comment  Room service appropriate? Yes   Fluid consistency: Thin      08/30/20 2124            Code Status: Full Code    Subjective 09/01/20    Pt continues to have significant flank pain requiring pain medications.  We discussed transitioning to PO pain meds and reserve IV morphine for breakthrough pain. She expresses agreement.  Denies abdominal cramping today, no vaginal bleeding.  No fever/chills.   Disposition Plan & Communication   Status is: Inpatient  Inpatient status appropriate due to severity of illness on IV therapies as above, ongoing evaluation  Dispo: The patient is from: home              Anticipated d/c is to: home              Patient currently not medically stable for dc.   Difficult to place patient no    Consults, Procedures, Significant Events   Consultants:   Obstetrics  Procedures:   None  Antimicrobials:  Anti-infectives (From  admission, onward)   Start     Dose/Rate Route Frequency Ordered Stop   08/31/20 1800  cefTRIAXone (ROCEPHIN) 1 g in sodium chloride 0.9 % 100 mL IVPB        1 g 200 mL/hr over 30 Minutes Intravenous Every 24 hours 08/30/20 2124     08/30/20 1845  cefTRIAXone (ROCEPHIN) 1 g in sodium chloride 0.9 % 100 mL IVPB        1 g 200 mL/hr over 30 Minutes Intravenous  Once 08/30/20 1833 08/30/20 1951        Micro    Objective   Vitals:   08/31/20 1546 08/31/20 1941 09/01/20 0500 09/01/20 0813  BP: (!) 107/43 (!) 98/48 (!) 97/45 (!) 105/52  Pulse: (!) 58 60 (!) 57 (!) 58   Resp: 18 18 17 16   Temp: 98 F (36.7 C) 98.4 F (36.9 C) 98 F (36.7 C) 98.4 F (36.9 C)  TempSrc:  Oral Oral   SpO2: 99% 100%  99%  Weight:      Height:        Intake/Output Summary (Last 24 hours) at 09/01/2020 1226 Last data filed at 09/01/2020 0114 Gross per 24 hour  Intake 2561.11 ml  Output --  Net 2561.11 ml   Filed Weights   08/30/20 1349 08/30/20 1355  Weight: 86.2 kg 86.2 kg    Physical Exam:  General exam: awake, alert, no acute distress Respiratory system: normal respiratory effort, on room air. Cardiovascular system: RRR, no pedal edema.   Gastrointestinal system: soft, NT, ND, L CVA tenderness Central nervous system: A&O x4. no gross focal neurologic deficits, normal speech Psychiatry: normal mood, flat affect  Labs   Data Reviewed: I have personally reviewed following labs and imaging studies  CBC: Recent Labs  Lab 08/30/20 1400 08/31/20 0512  WBC 10.2 7.6  HGB 13.4 12.0  HCT 39.3 34.8*  MCV 99.2 98.9  PLT 290 244   Basic Metabolic Panel: Recent Labs  Lab 08/30/20 1400 08/31/20 0512  NA 134* 136  K 3.3* 3.6  CL 107 110  CO2 20* 20*  GLUCOSE 97 89  BUN 8 8  CREATININE 0.66 0.56  CALCIUM 8.7* 8.5*   GFR: Estimated Creatinine Clearance: 108.6 mL/min (by C-G formula based on SCr of 0.56 mg/dL). Liver Function Tests: Recent Labs  Lab 08/31/20 0512  AST 13*  ALT 12  ALKPHOS 45  BILITOT 0.5  PROT 6.0*  ALBUMIN 3.2*   No results for input(s): LIPASE, AMYLASE in the last 168 hours. No results for input(s): AMMONIA in the last 168 hours. Coagulation Profile: No results for input(s): INR, PROTIME in the last 168 hours. Cardiac Enzymes: No results for input(s): CKTOTAL, CKMB, CKMBINDEX, TROPONINI in the last 168 hours. BNP (last 3 results) No results for input(s): PROBNP in the last 8760 hours. HbA1C: No results for input(s): HGBA1C in the last 72 hours. CBG: No results for input(s): GLUCAP in the last 168 hours. Lipid  Profile: No results for input(s): CHOL, HDL, LDLCALC, TRIG, CHOLHDL, LDLDIRECT in the last 72 hours. Thyroid Function Tests: No results for input(s): TSH, T4TOTAL, FREET4, T3FREE, THYROIDAB in the last 72 hours. Anemia Panel: No results for input(s): VITAMINB12, FOLATE, FERRITIN, TIBC, IRON, RETICCTPCT in the last 72 hours. Sepsis Labs: No results for input(s): PROCALCITON, LATICACIDVEN in the last 168 hours.  Recent Results (from the past 240 hour(s))  Urine culture     Status: Abnormal   Collection Time: 08/30/20  5:50 PM   Specimen:  Urine, Random  Result Value Ref Range Status   Specimen Description   Final    URINE, RANDOM Performed at [facility], [address] [place], [place] [address]    Special Requests   Final    NONE Performed at [facility], [address] [place], [place] [address]    Culture (A)  Final    50,000 COLONIES/mL LACTOBACILLUS SPECIES Standardized susceptibility testing for this organism is not available. Performed at [facility], [address], [place], [place] [address]    Report Status 09/01/2020 FINAL  Final  SARS CORONAVIRUS 2 (TAT 6-24 HRS) Nasopharyngeal Nasopharyngeal Swab     Status: None   Collection Time: Sep 15, 2020  7:53 PM   Specimen: Nasopharyngeal Swab  Result Value Ref Range Status   SARS Coronavirus 2 NEGATIVE NEGATIVE Final    Comment: (NOTE) SARS-CoV-2 target nucleic acids are NOT DETECTED.  The SARS-CoV-2 RNA is generally detectable in upper and lower respiratory specimens during the acute phase of infection. Negative results do not preclude SARS-CoV-2 infection, do not rule out co-infections with other pathogens, and should not be used as the sole basis for treatment or other patient management decisions. Negative results must be combined with clinical observations, patient history, and epidemiological information. The expected result is Negative.  Fact Sheet for  Patients: HairSlick.no  Fact Sheet for Healthcare Providers: quierodirigir.com  This test is not yet approved or cleared by the [place] FDA and  has been authorized for detection and/or diagnosis of SARS-CoV-2 by FDA under an Emergency Use Authorization (EUA). This EUA will remain  in effect (meaning this test can be used) for the duration of the COVID-19 declaration under Se ction 564(b)(1) of the Act, 21 U.S.C. section 360bbb-3(b)(1), unless the authorization is terminated or revoked sooner.  Performed at [facility], [address], [place], [place] [address]       Imaging Studies   US Renal  Result Date: 09-15-2020 CLINICAL DATA:  Flank pain EXAM: RENAL / URINARY TRACT ULTRASOUND COMPLETE COMPARISON:  CT abdomen 02/16/2014 FINDINGS: Right Kidney: Renal measurements: 11 x 6.8 x 5 cm = volume: 195.6 mL. Echogenicity within normal limits. No mass or hydronephrosis visualized. Left Kidney: Renal measurements: 11 x 5.6 x 5.1 cm = volume: 166.2 mL. Echogenicity within normal limits. No mass or hydronephrosis visualized. Bladder: Appears normal for degree of bladder distention. Other: None. IMPRESSION: 1. Normal renal ultrasound. Electronically Signed   By: Elige Ko   On: 15-Sep-2020 19:11   US OB LESS THAN 14 WEEKS WITH OB TRANSVAGINAL  Result Date: 15-Sep-2020 CLINICAL DATA:  Initial evaluation for acute left adnexal pain for 1 day. Early pregnancy. EXAM: OBSTETRIC <14 WK [place] AND TRANSVAGINAL OB US TECHNIQUE: Both transabdominal and transvaginal ultrasound examinations were performed for complete evaluation of the gestation as well as the maternal uterus, adnexal regions, and pelvic cul-de-sac. Transvaginal technique was performed to assess early pregnancy. COMPARISON:  None available. FINDINGS: Intrauterine gestational sac: Single Yolk sac:  Negative. Embryo:  Present. Cardiac Activity: Negative. Heart Rate: N/A CRL: 3.5  mm   6 w   0 d                  [place] EDC: 04/25/2021 Subchorionic hemorrhage: Small subchorionic hemorrhage without mass effect. Maternal uterus/adnexae: Ovaries within normal limits bilaterally. 1.8 cm degenerating corpus luteal cyst noted within the left ovary. No adnexal mass. Small volume free fluid present within the pelvis. IMPRESSION: 1. Single intrauterine  pregnancy with internal embryo, but no detectable cardiac activity. Crown-rump length measures 3.5 mm. Findings are suspicious but not yet definitive for failed pregnancy. Recommend follow-up US in 10-14 days for definitive diagnosis. This recommendation follows SRU consensus guidelines: Diagnostic Criteria for Nonviable Pregnancy Early in the First Trimester. Malva Limes Engl J Med 2013; 161:0960-45; 369:1443-51. 2. Small subchorionic hemorrhage without mass effect. 3. 1.8 cm degenerating left ovarian corpus luteal cyst with small volume free fluid in the pelvis. Finding could contribute to acute left adnexal pain. Electronically Signed   By: Rise MuBenjamin  McClintock M.D.   On: 08/30/2020 19:36     Medications   Scheduled Meds: . [START ON 09/02/2020] COVID-19 mRNA vaccine (Moderna)  0.5 mL Intramuscular ONCE-1600   Continuous Infusions: . sodium chloride 75 mL/hr at 09/01/20 0114  . cefTRIAXone (ROCEPHIN)  IV 1 g (08/31/20 1751)       LOS: 2 days    Time spent: 25 minutes with > 50% spent in coordination of care and at bedside    Pennie BanterKelly A Reubin Bushnell, DO [facility]  09/01/2020, 12:26 PM      If 7PM-7AM, please contact night-coverage. How to contact the [facility] Attending or Consulting provider 7A - 7P or covering provider during after hours 7P -7A, for this patient?    1. Check the care team in [facility] and look for a) attending/consulting [facility] provider listed and b) the [facility] team listed 2. Log into [email or web address] and use [facility]'s universal password to access. If you do not have the password, please contact the hospital operator. 3. Locate the [facility]  provider you are looking for under [facility] and page to a number that you can be directly reached. 4. If you still have difficulty reaching the provider, please page the [facility] (Director on Call) for the Hospitalists listed on amion for assistance.

## 2020-09-02 ENCOUNTER — Inpatient Hospital Stay: Payer: BLUE CROSS/BLUE SHIELD | Admitting: Anesthesiology

## 2020-09-02 ENCOUNTER — Encounter
Admission: EM | Disposition: A | Discharge status: Home / Self Care | Payer: Self-pay | Source: Home / Self Care | Attending: Internal Medicine

## 2020-09-02 ENCOUNTER — Encounter: Payer: Self-pay | Admitting: Obstetrics and Gynecology

## 2020-09-02 DIAGNOSIS — F1120 Opioid dependence, uncomplicated: Secondary | ICD-10-CM

## 2020-09-02 DIAGNOSIS — F3160 Bipolar disorder, current episode mixed, unspecified: Secondary | ICD-10-CM

## 2020-09-02 DIAGNOSIS — Z8719 Personal history of other diseases of the digestive system: Secondary | ICD-10-CM

## 2020-09-02 DIAGNOSIS — F4310 Post-traumatic stress disorder, unspecified: Secondary | ICD-10-CM

## 2020-09-02 DIAGNOSIS — Z3A01 Less than 8 weeks gestation of pregnancy: Secondary | ICD-10-CM

## 2020-09-02 DIAGNOSIS — F32A Depression, unspecified: Secondary | ICD-10-CM | POA: Diagnosis present

## 2020-09-02 DIAGNOSIS — O021 Missed abortion: Secondary | ICD-10-CM

## 2020-09-02 DIAGNOSIS — F419 Anxiety disorder, unspecified: Secondary | ICD-10-CM

## 2020-09-02 HISTORY — PX: DILATION AND EVACUATION: SHX1459

## 2020-09-02 LAB — BASIC METABOLIC PANEL
Anion gap: 5 (ref 5–15)
BUN: 7 mg/dL (ref 6–20)
CO2: 20 mmol/L — ABNORMAL LOW (ref 22–32)
Calcium: 8.6 mg/dL — ABNORMAL LOW (ref 8.9–10.3)
Chloride: 112 mmol/L — ABNORMAL HIGH (ref 98–111)
Creatinine, Ser: 0.68 mg/dL (ref 0.44–1.00)
GFR, Estimated: >60 mL/min (ref >60)
Glucose, Bld: 101 mg/dL — ABNORMAL HIGH (ref 70–99)
Potassium: 3.6 mmol/L (ref 3.5–5.1)
Sodium: 137 mmol/L (ref 135–145)

## 2020-09-02 LAB — CBC
HCT: 34 % — ABNORMAL LOW (ref 36.0–46.0)
Hemoglobin: 11.8 g/dL — ABNORMAL LOW (ref 12.0–15.0)
MCH: 34.3 pg — ABNORMAL HIGH (ref 26.0–34.0)
MCHC: 34.7 g/dL (ref 30.0–36.0)
MCV: 98.8 fL (ref 80.0–100.0)
Platelets: 237 10*3/uL (ref 150–400)
RBC: 3.44 MIL/uL — ABNORMAL LOW (ref 3.87–5.11)
RDW: 12.5 % (ref 11.5–15.5)
WBC: 7.3 10*3/uL (ref 4.0–10.5)
nRBC: 0 % (ref 0.0–0.2)

## 2020-09-02 LAB — URINE DRUG SCREEN, QUALITATIVE (ARMC ONLY)
Amphetamines, Ur Screen: NOT DETECTED
Barbiturates, Ur Screen: NOT DETECTED
Benzodiazepine, Ur Scrn: NOT DETECTED
Cannabinoid 50 Ng, Ur ~~LOC~~: NOT DETECTED
Cocaine Metabolite,Ur ~~LOC~~: NOT DETECTED
MDMA (Ecstasy)Ur Screen: NOT DETECTED
Methadone Scn, Ur: NOT DETECTED
Opiate, Ur Screen: POSITIVE — AB
Phencyclidine (PCP) Ur S: NOT DETECTED
Tricyclic, Ur Screen: POSITIVE — AB

## 2020-09-02 LAB — MAGNESIUM: Magnesium: 1.7 mg/dL (ref 1.7–2.4)

## 2020-09-02 SURGERY — DILATION AND EVACUATION, UTERUS
Anesthesia: General

## 2020-09-02 MED ORDER — FENTANYL CITRATE (PF) 100 MCG/2ML IJ SOLN
INTRAMUSCULAR | Status: DC | PRN
  Administered 2020-09-02 (×2): 50 ug via INTRAVENOUS

## 2020-09-02 MED ORDER — FENTANYL CITRATE (PF) 100 MCG/2ML IJ SOLN
25.0000 ug | INTRAMUSCULAR | Status: DC | PRN
  Administered 2020-09-02: 25 ug via INTRAVENOUS

## 2020-09-02 MED ORDER — PROPOFOL 10 MG/ML IV BOLUS
INTRAVENOUS | Status: DC | PRN
  Administered 2020-09-02: 150 mg via INTRAVENOUS
  Administered 2020-09-02: 30 mg via INTRAVENOUS

## 2020-09-02 MED ORDER — FENTANYL CITRATE (PF) 100 MCG/2ML IJ SOLN
INTRAMUSCULAR | Status: AC
  Filled 2020-09-02: qty 2

## 2020-09-02 MED ORDER — DOXYCYCLINE HYCLATE 100 MG IV SOLR
200.0000 mg | INTRAVENOUS | Status: AC
  Administered 2020-09-02: 200 mg via INTRAVENOUS
  Filled 2020-09-02: qty 200

## 2020-09-02 MED ORDER — RHO D IMMUNE GLOBULIN 1500 UNIT/2ML IJ SOSY
300.0000 ug | PREFILLED_SYRINGE | Freq: Once | INTRAMUSCULAR | Status: AC
  Administered 2020-09-02: 300 ug via INTRAMUSCULAR
  Filled 2020-09-02: qty 2

## 2020-09-02 MED ORDER — LACTATED RINGERS IV SOLN: INTRAVENOUS | Status: DC

## 2020-09-02 MED ORDER — MIDAZOLAM HCL 2 MG/2ML IJ SOLN
INTRAMUSCULAR | Status: AC
  Filled 2020-09-02: qty 2

## 2020-09-02 MED ORDER — MAGNESIUM SULFATE 2 GM/50ML IV SOLN
2.0000 g | Freq: Once | INTRAVENOUS | Status: AC
  Administered 2020-09-02: 2 g via INTRAVENOUS
  Filled 2020-09-02: qty 50

## 2020-09-02 MED ORDER — DEXMEDETOMIDINE (PRECEDEX) IN NS 20 MCG/5ML (4 MCG/ML) IV SYRINGE
PREFILLED_SYRINGE | INTRAVENOUS | Status: DC | PRN
  Administered 2020-09-02: 8 ug via INTRAVENOUS

## 2020-09-02 MED ORDER — FENTANYL CITRATE (PF) 100 MCG/2ML IJ SOLN
INTRAMUSCULAR | Status: AC
  Administered 2020-09-02: 25 ug via INTRAVENOUS
  Filled 2020-09-02: qty 2

## 2020-09-02 MED ORDER — SENNOSIDES-DOCUSATE SODIUM 8.6-50 MG PO TABS
1.0000 | ORAL_TABLET | Freq: Two times a day (BID) | ORAL | Status: DC
  Administered 2020-09-02 – 2020-09-03 (×2): 1 via ORAL
  Filled 2020-09-02 (×2): qty 1

## 2020-09-02 MED ORDER — POVIDONE-IODINE 10 % EX SWAB: 2.0000 "application " | Freq: Once | CUTANEOUS | Status: DC

## 2020-09-02 MED ORDER — ONDANSETRON HCL 4 MG/2ML IJ SOLN
INTRAMUSCULAR | Status: DC | PRN
  Administered 2020-09-02: 4 mg via INTRAVENOUS

## 2020-09-02 MED ORDER — LIDOCAINE HCL (PF) 1 % IJ SOLN
INTRAMUSCULAR | Status: AC
  Filled 2020-09-02: qty 30

## 2020-09-02 MED ORDER — ACETAMINOPHEN 500 MG PO TABS
ORAL_TABLET | ORAL | Status: AC
  Filled 2020-09-02: qty 2

## 2020-09-02 MED ORDER — EPHEDRINE SULFATE 50 MG/ML IJ SOLN
INTRAMUSCULAR | Status: DC | PRN
  Administered 2020-09-02 (×2): 10 mg via INTRAVENOUS

## 2020-09-02 MED ORDER — ACETAMINOPHEN 500 MG PO TABS
1000.0000 mg | ORAL_TABLET | ORAL | Status: AC
  Administered 2020-09-02: 1000 mg via ORAL

## 2020-09-02 MED ORDER — GABAPENTIN 400 MG PO CAPS
400.0000 mg | ORAL_CAPSULE | Freq: Three times a day (TID) | ORAL | Status: DC
  Administered 2020-09-02 – 2020-09-03 (×4): 400 mg via ORAL
  Filled 2020-09-02 (×4): qty 1

## 2020-09-02 MED ORDER — DEXAMETHASONE SODIUM PHOSPHATE 10 MG/ML IJ SOLN
INTRAMUSCULAR | Status: DC | PRN
  Administered 2020-09-02: 4 mg via INTRAVENOUS

## 2020-09-02 MED ORDER — MIDAZOLAM HCL 2 MG/2ML IJ SOLN
INTRAMUSCULAR | Status: DC | PRN
  Administered 2020-09-02: 2 mg via INTRAVENOUS

## 2020-09-02 MED ORDER — SODIUM CHLORIDE 0.9 % IV BOLUS
1000.0000 mL | Freq: Once | INTRAVENOUS | Status: AC
  Administered 2020-09-02: 1000 mL via INTRAVENOUS

## 2020-09-02 MED ORDER — ONDANSETRON HCL 4 MG/2ML IJ SOLN: 4.0000 mg | Freq: Once | INTRAMUSCULAR | Status: DC | PRN

## 2020-09-02 MED ORDER — LIDOCAINE HCL (CARDIAC) PF 100 MG/5ML IV SOSY
PREFILLED_SYRINGE | INTRAVENOUS | Status: DC | PRN
  Administered 2020-09-02: 60 mg via INTRAVENOUS

## 2020-09-02 MED ORDER — SILVER NITRATE-POT NITRATE 75-25 % EX MISC
CUTANEOUS | Status: AC
  Filled 2020-09-02: qty 10

## 2020-09-02 MED ORDER — POLYETHYLENE GLYCOL 3350 17 G PO PACK: 17.0000 g | PACK | Freq: Every day | ORAL | Status: DC | PRN

## 2020-09-02 MED ORDER — PROPOFOL 10 MG/ML IV BOLUS
INTRAVENOUS | Status: AC
  Filled 2020-09-02: qty 20

## 2020-09-02 SURGICAL SUPPLY — 26 items
CATH ROBINSON RED A/P 16FR (CATHETERS) ×2 IMPLANT
COVER WAND RF STERILE (DRAPES) IMPLANT
DRSG TELFA 3X8 NADH (GAUZE/BANDAGES/DRESSINGS) ×2 IMPLANT
FILTER UTR ASPR SPEC (MISCELLANEOUS) ×1 IMPLANT
FLTR UTR ASPR SPEC (MISCELLANEOUS) ×2
GAUZE 4X4 16PLY RFD (DISPOSABLE) ×2 IMPLANT
GLOVE SURG ENC MOIS LTX SZ6.5 (GLOVE) ×4 IMPLANT
GLOVE SURG UNDER LTX SZ7 (GLOVE) ×4 IMPLANT
GOWN STRL REUS W/ TWL LRG LVL3 (GOWN DISPOSABLE) ×2 IMPLANT
GOWN STRL REUS W/TWL LRG LVL3 (GOWN DISPOSABLE) ×2
KIT BERKELEY 1ST TRIMESTER 3/8 (MISCELLANEOUS) ×2 IMPLANT
KIT TURNOVER KIT A (KITS) ×2 IMPLANT
MANIFOLD NEPTUNE II (INSTRUMENTS) ×2 IMPLANT
NEEDLE HYPO 25X1 1.5 SAFETY (NEEDLE) ×2 IMPLANT
NEEDLE SPNL 22GX5 LNG QUINC BK (NEEDLE) ×2 IMPLANT
PACK DNC HYST (MISCELLANEOUS) ×2 IMPLANT
PAD OB MATERNITY 4.3X12.25 (PERSONAL CARE ITEMS) ×2 IMPLANT
PAD PREP 24X41 OB/GYN DISP (PERSONAL CARE ITEMS) ×2 IMPLANT
SET BERKELEY SUCTION TUBING (SUCTIONS) ×2 IMPLANT
SOL PREP PVP 2OZ (MISCELLANEOUS) ×2
SOLUTION PREP PVP 2OZ (MISCELLANEOUS) ×1 IMPLANT
SYR 10ML LL (SYRINGE) ×2 IMPLANT
VACURETTE 10 RIGID CVD (CANNULA) IMPLANT
VACURETTE 12 RIGID CVD (CANNULA) IMPLANT
VACURETTE 6 ASPIR F TIP BERK (CANNULA) ×2 IMPLANT
VACURETTE 8 RIGID CVD (CANNULA) IMPLANT

## 2020-09-02 NOTE — Op Note (Addendum)
Procedure(s): DILATATION AND EVACUATION Procedure Note  Dominique Bennett female 35 y.o. 09/02/2020  Indications: The patient is a 35 y.o. [phone] female with missed abortion at [redacted] weeks gestation, currently admitted to the hospital for treatment for pyelonephritis. Also with a PMH of kidney stones, substance abuse, bipolar I disorder, depression, PTSD, asthma, and prior pregnancy losses and preterm deliveries.  Patient was counseled on management options for missed abortion and opted for surgical management with D&E.  Pre-operative Diagnosis: Missed abortion at [redacted] weeks gestation, pyelonephritis, h/o kidney stones, substance abuse, bipolar I disorder, depression, PTSD, asthma, and prior pregnancy losses and preterm deliveries.  Rh negative status.   Post-operative Diagnosis: Same  Surgeon: Hildred Laser, MD  Assistants:  None  Anesthesia: General endotracheal anesthesia  Findings: Uterus was midplane and 8 week size. Tissue consistent with products of conception was removed and sent to Pathology.  Procedure Details: The patient was seen in the Holding Room. The risks, benefits, complications, treatment options, and expected outcomes were discussed with the patient.  The patient concurred with the proposed plan, giving informed consent.  The site of surgery properly noted/marked. The patient was taken to the Operating Room, identified as [name] and the procedure verified as Procedure(s) (LRB): DILATATION AND EVACUATION (N/A). A Time Out was held and the above information confirmed.  She was then placed under general anesthesia without difficulty. She was placed in the dorsal lithotomy position, and was prepped and draped in a sterile manner.  A straight catheterization was performed. A sterile speculum was inserted into the vagina and the cervix was grasped at the anterior lip using a single-toothed tenaculum.  The uterus was sounded to 8.5 cm. The cervix was gently dilated using Shawnie Pons  Dilators to accommodate a 6 mm suction curette, that was gently advanced to the uterine fundus.  The suction device was then activated and the curette was slowly rotated to clear the uterine cavity of products of conception.  A sharp curettage with a serrated curette  was then performed to confirm complete emptying of the uterus. Moderate bleeding was encountered. The tenaculum was removed along with all instruments  from the  vagina.  Bleeding was noted from the tenaculum sites, so pressure was applied and silver nitrate sticks were used for hemostasis. The patient was awakened, mobilized and taken to the recovery room in satisfactory condition.  The procedure was well-tolerated.  The patient will be discharged back to her hospital room as per PACU criteria. She will receive a dose of Rhogam while in the PACU area as she is Rh negative. Routine postoperative instructions will be given.  She will follow up in the clinic in 1-2 weeks for postoperative evaluation.   Estimated Blood Loss:  525 ml      Drains: straight catheterization prior to procedure with  100 ml of clear urine         Total IV Fluids:  750 ml  Specimens:  Products of conception         Implants: None         Complications:  None; patient tolerated the procedure well.         Disposition: PACU - hemodynamically stable.         Condition: stable   Hildred Laser, MD [facility]

## 2020-09-02 NOTE — Progress Notes (Signed)
Hospital Day # 3, admitted for pyelonephritis.  Currently pregnant with missed AB at [redacted] weeks gestation. Also with a h/o bipolar disorder, depression, PTSD, asthma, history of substance use, IBS-C. Also with a h/o recurrent kidney stones.   Subjective: voiding, tolerating PO and patient still reports complaints of pain in her back.  Denies fevers, or chills.   Objective: Temp:  [98.1 F (36.7 C)-98.5 F (36.9 C)] 98.5 F (36.9 C) (05/02 0725) Pulse Rate:  [55-61] 58 (05/02 0725) Resp:  [16-18] 18 (05/02 0725) BP: (92-107)/(49-57) 92/51 (05/02 0725) SpO2:  [97 %-100 %] 98 % (05/02 0725) FiO2 (%):  [0 %] 0 % (05/02 0528)   Scheduled Meds: . busPIRone  15 mg Oral TID  . COVID-19 mRNA vaccine (Moderna)  0.5 mL Intramuscular ONCE-1600  . docusate sodium  100 mg Oral BID  . FLUoxetine  40 mg Oral Daily  . QUEtiapine  150 mg Oral QAC breakfast  . QUEtiapine  450 mg Oral QHS   Continuous Infusions: . sodium chloride 75 mL/hr at 09/01/20 1812  . cefTRIAXone (ROCEPHIN)  IV 1 g (09/01/20 1722)    PRN Meds:.acetaminophen **OR** acetaminophen, albuterol, bisacodyl, diphenhydrAMINE, ipratropium, magnesium citrate, morphine injection, ondansetron **OR** ondansetron (ZOFRAN) IV, oxyCODONE-acetaminophen, senna-docusate, traZODone  Physical Exam:  General: alert and no distress  Lungs: clear to auscultation bilaterally Heart: regular rate and rhythm, S1, S2 normal, no murmur, click, rub or gallop Abdomen: soft, non-tender; bowel sounds normal; no masses,  no organomegaly Pelvis:Bleeding: None Extremities: DVT Evaluation: No evidence of DVT seen on physical exam. Negative Homan's sign.  No cords or calf tenderness. No significant calf/ankle edema.  SCDs on.     CBC Latest Ref Rng & Units 09/02/2020 08/31/2020 08/30/2020  WBC 4.0 - 10.5 K/uL 7.3 7.6 10.2  Hemoglobin 12.0 - 15.0 g/dL 11.8(L) 12.0 13.4  Hematocrit 36.0 - 46.0 % 34.0(L) 34.8(L) 39.3  Platelets 150 - 400 K/uL 237 244 290       CMP Latest Ref Rng & Units 09/02/2020 08/31/2020 08/30/2020  Glucose 70 - 99 mg/dL 372(B) 89 97  BUN 6 - 20 mg/dL 7 8 8   Creatinine 0.44 - 1.00 mg/dL 0.21 1.15  Sodium 135 - 145 mmol/L 137 136 134(L)  Potassium 3.5 - 5.1 mmol/L 3.6 3.6 3.3(L)  Chloride 98 - 111 mmol/L 112(H) 110 107  CO2 22 - 32 mmol/L 20(L) 20(L) 20(L)  Calcium 8.9 - 10.3 mg/dL 5.20) 8.0(E) 2.3(V)  Total Protein 6.5 - 8.1 g/dL - 6.0(L) -  Total Bilirubin 0.3 - 1.2 mg/dL - 0.5 -  Alkaline Phos 38 - 126 U/L - 45 -  AST 15 - 41 U/L - 13(L) -  ALT 0 - 44 U/L - 12 -    Results for KAYLEIGH, BROADWELL (MRN Lianne Cure) as of 09/01/2020 15:31  Ref. Range 08/30/2020 17:50 08/31/2020 05:12 09/01/2020 11:52  HCG, Beta Chain, Quant, S Latest Ref Range: <5 mIU/mL 17,270 (H)  12,302 (H)    Assessment/Plan: 1. Pyelonephritis - Currently receiving IV pain medication (Ceftriaxone q 24 hrs).  Also receiving IV pain medication (Morphine). Is transitioning to PO pain meds. Still noting some pain. Caution concerning narcotic medications due to patient's history of substance use and her IBS-C.  2. Missed abortion - Had further discussion with patient about management of current pregnancy loss.  Discussed management of missed abortion: expectant management vs misoprostol vs D&E.  Risks and benefits of all modalities discussed; all questions answered.  Patient opted for D&E. Risks of surgery including  bleeding, infection, injury to surrounding organs, need for additional procedures, possibility of intrauterine scarring which may impair future fertility, risk of retained products which may require further management and other postoperative/anesthesia complications were explained to patient.  WIll notify OR of plans. Patient reports being NPO since last night. Can also offer chaplain counseling if desired.   3. H/o bipolar disorder, resumed medications.   4. H/o IBS, will ensure that patient is on bowel regimen while inpatient.      Hildred Laser, MD [facility]

## 2020-09-02 NOTE — Anesthesia Postprocedure Evaluation (Signed)
Anesthesia Post Note  Patient: Dominique Bennett  Procedure(s) Performed: DILATATION AND EVACUATION (N/A )  Patient location during evaluation: PACU Anesthesia Type: General Level of consciousness: awake and alert Pain management: pain level controlled Vital Signs Assessment: post-procedure vital signs reviewed and stable Respiratory status: spontaneous breathing, nonlabored ventilation, respiratory function stable and patient connected to nasal cannula oxygen Cardiovascular status: blood pressure returned to baseline and stable Postop Assessment: no apparent nausea or vomiting Anesthetic complications: no   No complications documented.   Last Vitals:  Vitals:   09/02/20 1200 09/02/20 1248  BP: (!) 101/59 (!) 96/54  Pulse: 66 63  Resp: 12 18  Temp: (!) 36.2 C 37.1 C  SpO2: 99% 98%    Last Pain:  Vitals:   09/02/20 1248  TempSrc:   PainSc: 9                  Yevette Edwards

## 2020-09-02 NOTE — Progress Notes (Signed)
PROGRESS NOTE    Dominique Bennett  YIA:[id number] DOB: 1985-07-21 DOA: 08/30/2020 PCP: Pcp, No (Confirm with patient/family/NH records and if not entered, this HAS to be entered at [facility] point of entry. "No PCP" if truly none.)   Chief Complaint  Patient presents with  . Flank Pain    Brief Narrative:  RobinMartinezis a 35 y.o.femaleG8 P0-4-3-4 currently [redacted] weeks pregnant by dateswith a known history ofanxiety, asthma, bipolar disorder, carpal tunnel syndrome, chronic back pain, depression, IBS, migraines, PTSD, sleep disorder and history of substance use disorderpresents to the emergency department for evaluation of left flank pain, dysuria and nausea.  History of recurrent kidney stones for many years.   Of note, at time of admission, denied any lower abdominal cramping, discharge, or vaginal bleeding. Reported recently establishing her obstetric care with [facility], Dr. Logan Bores.  Admitted for management of acute pyelonephritis.   On empiric Rocephin pending urine culture.  OB is consulted given U/S in ED concerning for spontaneous abortion.  Pt 10 weeks by dates, fetal size 6 weeks on U/S, and fetal heart tones identified.  Pt reports this would be her 4th loss of pregnancy.    Assessment & Plan:   Principal Problem:   Pyelonephritis Active Problems:   PTSD (post-traumatic stress disorder)   Opioid use disorder, moderate, dependence (HCC)   Bipolar disorder (HCC)   History of IBS   Missed abortion   Anxiety and depression   #1 acute pyelonephritis -Patient presented with complaints of dysuria, left flank pain, urinalysis concerning for UTI. -Renal ultrasound unremarkable. -Urine cultures with 50,000 colonies of lactobacillus species.  Sensitivities pending. -Patient still with significant pain. -Continue IV Rocephin pending sensitivities -IV fluids. -Then transition to p.o. meds with IV pain medication for breakthrough pain only. -IV antiemetics. -Add IV  Toradol. -Follow.  2.  Missed abortion -Patient seen in consultation by OB/GYN, Dr. Valentino Saxon, -Lupus anticoagulant ordered and pending. -hCG 17,270 >>> 12,302 -Patient for D and C today -Per OB/GYN.  3.  History of polysubstance abuse -Polysubstance abuse cessation. -TOC consulted for resources.  4.  Depression/anxiety/PTSD/bipolar disorder -Stable. -Continue home regimen of Prozac, Seroquel, trazodone. -It is noted that OB recommended weaning down all of her medications but patient feels they are required and helpful in avoiding substance abuse. -Outpatient follow-up.  5.  Obesity  6.  Borderline blood pressure -1 L normal saline bolus x1, then normal saline at 125 cc an hour. -Follow.  7.  History of IBS -DC Colace. -Senokot-S twice daily. -MiraLAX daily as needed.    DVT prophylaxis: SCDs Code Status: Full Family Communication: Updated patient.  No family at bedside. Disposition:   Status is: Inpatient    Dispo: The patient is from: Home              Anticipated d/c is to: Home              Patient currently admitted for acute pyelonephritis, on IV antibiotics, failed pregnancy for D and C today.  Not stable for discharge.   Difficult to place patient no       Consultants:   OB/GYN: Dr. Valentino Saxon 08/31/2020  Procedures:   Renal ultrasound 08/30/2020  D and C pending 09/02/2020  Antimicrobials:   IV Rocephin 08/31/2020>>>.   Subjective: Laying in bed.  Still with complaints of significant left flank pain.  Dysuria improved.  Suprapubic abdominal pain improved.  No nausea or vomiting.  No chest pain.  No shortness of breath. About to go to the OR  for D and C this morning.  Objective: Vitals:   09/01/20 2036 09/02/20 0528 09/02/20 0725 09/02/20 0843  BP: (!) 107/57 (!) 93/49 (!) 92/51 (!) 111/44  Pulse: (!) 55 61 (!) 58 60  Resp: 16 16 18 12   Temp: 98.3 F (36.8 C) 98.1 F (36.7 C) 98.5 F (36.9 C) 98 F (36.7 C)  TempSrc: Oral Oral Oral Oral   SpO2: 98% 97% 98% 98%  Weight:      Height:        Intake/Output Summary (Last 24 hours) at 09/02/2020 0927 Last data filed at 09/01/2020 1820 Gross per 24 hour  Intake 0 ml  Output --  Net 0 ml   Filed Weights   08/30/20 1349 08/30/20 1355  Weight: 86.2 kg 86.2 kg    Examination:  General exam: Appears calm and comfortable  Respiratory system: Clear to auscultation. Respiratory effort normal. Cardiovascular system: S1 & S2 heard, RRR. No JVD, murmurs, rubs, gallops or clicks. No pedal edema. Gastrointestinal system: Abdomen is nondistended, soft and nontender.  Positive left CVA TTP.  No organomegaly or masses felt. Normal bowel sounds heard. Central nervous system: Alert and oriented. No focal neurological deficits. Extremities: Symmetric 5 x 5 power. Skin: No rashes, lesions or ulcers Psychiatry: Judgement and insight appear normal. Mood & affect somewhat depressed.     Data Reviewed: I have personally reviewed following labs and imaging studies  CBC: Recent Labs  Lab 08/30/20 1400 08/31/20 0512 09/02/20 0448  WBC 10.2 7.6 7.3  HGB 13.4 12.0 11.8*  HCT 39.3 34.8* 34.0*  MCV 99.2 98.9 98.8  PLT 290 244 237    Basic Metabolic Panel: Recent Labs  Lab 08/30/20 1400 08/31/20 0512 09/02/20 0448  NA 134* 136 137  K 3.3* 3.6 3.6  CL 107 110 112*  CO2 20* 20* 20*  GLUCOSE 97 89 101*  BUN 8 8 7   CREATININE 0.66 0.56 0.68  CALCIUM 8.7* 8.5* 8.6*  MG  --   --  1.7    GFR: Estimated Creatinine Clearance: 108.6 mL/min (by C-G formula based on SCr of 0.68 mg/dL).  Liver Function Tests: Recent Labs  Lab 08/31/20 0512  AST 13*  ALT 12  ALKPHOS 45  BILITOT 0.5  PROT 6.0*  ALBUMIN 3.2*    CBG: No results for input(s): GLUCAP in the last 168 hours.   Recent Results (from the past 240 hour(s))  Urine culture     Status: Abnormal   Collection Time: 08/30/20  5:50 PM   Specimen: Urine, Random  Result Value Ref Range Status   Specimen Description   Final     URINE, RANDOM Performed at [facility], [address], [place], [address] [place]    Special Requests   Final    NONE Performed at [facility], [address] [place], [address] [place] [address] [place]    Culture (A)  Final    50,000 COLONIES/mL LACTOBACILLUS SPECIES Standardized susceptibility testing for this organism is not available. Performed at [facility], [address] [place], [address] [place]    Report Status 09/01/2020 FINAL  Final  SARS CORONAVIRUS 2 (TAT 6-24 HRS) Nasopharyngeal Nasopharyngeal Swab     Status: None   Collection Time: 08/30/20  7:53 PM   Specimen: Nasopharyngeal Swab  Result Value Ref Range Status   SARS Coronavirus 2 NEGATIVE NEGATIVE Final    Comment: (NOTE) SARS-CoV-2 target nucleic acids are NOT DETECTED.  The SARS-CoV-2 RNA is generally detectable in upper and  lower respiratory specimens during the acute phase of infection. Negative results do not preclude SARS-CoV-2 infection, do not rule out co-infections with other pathogens, and should not be used as the sole basis for treatment or other patient management decisions. Negative results must be combined with clinical observations, patient history, and epidemiological information. The expected result is Negative.  Fact Sheet for Patients: HairSlick.no  Fact Sheet for Healthcare Providers: quierodirigir.com  This test is not yet approved or cleared by the [place] FDA and  has been authorized for detection and/or diagnosis of SARS-CoV-2 by FDA under an Emergency Use Authorization (EUA). This EUA will remain  in effect (meaning this test can be used) for the duration of the COVID-19 declaration under Se ction 564(b)(1) of the Act, 21 U.S.C. section 360bbb-3(b)(1), unless the authorization is terminated or revoked sooner.  Performed at [facility], [address], [place], [place] [address]           Radiology Studies: No results found.      Scheduled Meds: . acetaminophen      . [MAR Hold] busPIRone  15 mg Oral TID  . [MAR Hold] COVID-19 mRNA vaccine (Moderna)  0.5 mL Intramuscular ONCE-1600  . [MAR Hold] FLUoxetine  40 mg Oral Daily  . povidone-iodine  2 application Topical Once  . [MAR Hold] QUEtiapine  150 mg Oral QAC breakfast  . [MAR Hold] QUEtiapine  450 mg Oral QHS  . [MAR Hold] senna-docusate  1 tablet Oral BID   Continuous Infusions: . sodium chloride 75 mL/hr at 09/01/20 1812  . [MAR Hold] cefTRIAXone (ROCEPHIN)  IV 1 g (09/01/20 1722)  . doxycycline (VIBRAMYCIN) IV    . lactated ringers       LOS: 3 days    Time spent: 35 minutes    Ramiro Harvest, MD [facility]   To contact the attending provider between 7A-7P or the covering provider during after hours 7P-7A, please log into the web site [email or web address] and access using universal [place] password for that web site. If you do not have the password, please call the hospital operator.  09/02/2020, 9:27 AM

## 2020-09-02 NOTE — Anesthesia Preprocedure Evaluation (Signed)
Anesthesia Evaluation  Patient identified by MRN, date of birth, ID band Patient awake    Reviewed: Allergy & Precautions, H&P , NPO status , Patient's Chart, lab work & pertinent test results, reviewed documented beta blocker date and time   Airway Mallampati: II  TM Distance: >3 FB Neck ROM: full    Dental  (+) Teeth Intact   Pulmonary neg pulmonary ROS, neg shortness of breath, asthma , Current Smoker,    Pulmonary exam normal        Cardiovascular Exercise Tolerance: Good negative cardio ROS Normal cardiovascular exam Rate:Normal     Neuro/Psych  Headaches, PSYCHIATRIC DISORDERS Anxiety Depression Bipolar Disorder  Neuromuscular disease negative neurological ROS  negative psych ROS   GI/Hepatic negative GI ROS, Neg liver ROS,   Endo/Other  negative endocrine ROS  Renal/GU negative Renal ROS  negative genitourinary   Musculoskeletal   Abdominal   Peds  Hematology negative hematology ROS (+)   Anesthesia Other Findings   Reproductive/Obstetrics negative OB ROS                             Anesthesia Physical Anesthesia Plan  ASA: II and emergent  Anesthesia Plan: General LMA   Post-op Pain Management:    Induction:   PONV Risk Score and Plan:   Airway Management Planned:   Additional Equipment:   Intra-op Plan:   Post-operative Plan:   Informed Consent: I have reviewed the patients History and Physical, chart, labs and discussed the procedure including the risks, benefits and alternatives for the proposed anesthesia with the patient or authorized representative who has indicated his/her understanding and acceptance.       Plan Discussed with: CRNA  Anesthesia Plan Comments:         Anesthesia Quick Evaluation

## 2020-09-02 NOTE — Progress Notes (Signed)
Administered rhogam LD, patient tolerated without event.

## 2020-09-02 NOTE — Transfer of Care (Signed)
Immediate Anesthesia Transfer of Care Note  Patient: Dominique Bennett  Procedure(s) Performed: DILATATION AND EVACUATION (N/A )  Patient Location: PACU  Anesthesia Type:General  Level of Consciousness: awake  Airway & Oxygen Therapy: Patient connected to face mask oxygen  Post-op Assessment: Report given to RN  Post vital signs: stable  Last Vitals:  Vitals Value Taken Time  BP 95/61 09/02/20 1110  Temp 36.2 C 09/02/20 1110  Pulse 65 09/02/20 1114  Resp 16 09/02/20 1114  SpO2 100 % 09/02/20 1114  Vitals shown include unvalidated device data.  Last Pain:  Vitals:   09/02/20 1110  TempSrc: Oral  PainSc:       Patients Stated Pain Goal: 0 (08/31/20 0800)  Complications: No complications documented.

## 2020-09-02 NOTE — Anesthesia Procedure Notes (Signed)
Procedure Name: LMA Insertion Date/Time: 09/02/2020 10:05 AM Performed by: Alex Gardener, CRNA Pre-anesthesia Checklist: Patient identified, Emergency Drugs available, Suction available, Patient being monitored and Timeout performed Patient Re-evaluated:Patient Re-evaluated prior to induction Oxygen Delivery Method: Circle system utilized Preoxygenation: Pre-oxygenation with 100% oxygen Induction Type: IV induction Ventilation: Mask ventilation without difficulty LMA: LMA inserted LMA Size: 4.0 Number of attempts: 1 Placement Confirmation: positive ETCO2 and breath sounds checked- equal and bilateral Comments: Atraumatic, teeth and mucous membranes in original condition.

## 2020-09-03 ENCOUNTER — Other Ambulatory Visit: Payer: Self-pay

## 2020-09-03 LAB — CBC WITH DIFFERENTIAL/PLATELET
Abs Immature Granulocytes: 0.05 10*3/uL (ref 0.00–0.07)
Basophils Absolute: 0 10*3/uL (ref 0.0–0.1)
Basophils Relative: 0 %
Eosinophils Absolute: 0 10*3/uL (ref 0.0–0.5)
Eosinophils Relative: 0 %
HCT: 27.4 % — ABNORMAL LOW (ref 36.0–46.0)
Hemoglobin: 9.4 g/dL — ABNORMAL LOW (ref 12.0–15.0)
Immature Granulocytes: 0 %
Lymphocytes Relative: 20 %
Lymphs Abs: 2.4 10*3/uL (ref 0.7–4.0)
MCH: 34.2 pg — ABNORMAL HIGH (ref 26.0–34.0)
MCHC: 34.3 g/dL (ref 30.0–36.0)
MCV: 99.6 fL (ref 80.0–100.0)
Monocytes Absolute: 0.9 10*3/uL (ref 0.1–1.0)
Monocytes Relative: 7 %
Neutro Abs: 9.1 10*3/uL — ABNORMAL HIGH (ref 1.7–7.7)
Neutrophils Relative %: 73 %
Platelets: 221 10*3/uL (ref 150–400)
RBC: 2.75 MIL/uL — ABNORMAL LOW (ref 3.87–5.11)
RDW: 12.7 % (ref 11.5–15.5)
WBC: 12.4 10*3/uL — ABNORMAL HIGH (ref 4.0–10.5)
nRBC: 0 % (ref 0.0–0.2)

## 2020-09-03 LAB — BASIC METABOLIC PANEL
Anion gap: 6 (ref 5–15)
BUN: 7 mg/dL (ref 6–20)
CO2: 20 mmol/L — ABNORMAL LOW (ref 22–32)
Calcium: 8.5 mg/dL — ABNORMAL LOW (ref 8.9–10.3)
Chloride: 111 mmol/L (ref 98–111)
Creatinine, Ser: 0.69 mg/dL (ref 0.44–1.00)
GFR, Estimated: >60 mL/min (ref >60)
Glucose, Bld: 99 mg/dL (ref 70–99)
Potassium: 3.4 mmol/L — ABNORMAL LOW (ref 3.5–5.1)
Sodium: 137 mmol/L (ref 135–145)

## 2020-09-03 LAB — LUPUS ANTICOAGULANT PANEL
DRVVT: 29.9 s (ref 0.0–47.0)
PTT Lupus Anticoagulant: 32.8 s (ref 0.0–51.9)

## 2020-09-03 LAB — RHOGAM INJECTION: Unit division: 0

## 2020-09-03 LAB — TYPE AND SCREEN
ABO/RH(D): A NEG
Antibody Screen: NEGATIVE

## 2020-09-03 LAB — SURGICAL PATHOLOGY

## 2020-09-03 LAB — MAGNESIUM: Magnesium: 1.6 mg/dL — ABNORMAL LOW (ref 1.7–2.4)

## 2020-09-03 MED ORDER — POTASSIUM CHLORIDE CRYS ER 20 MEQ PO TBCR
40.0000 meq | EXTENDED_RELEASE_TABLET | Freq: Once | ORAL | Status: AC
  Administered 2020-09-03: 40 meq via ORAL
  Filled 2020-09-03: qty 2

## 2020-09-03 MED ORDER — CIPROFLOXACIN HCL 500 MG PO TABS
500.0000 mg | ORAL_TABLET | Freq: Two times a day (BID) | ORAL | Status: DC
  Administered 2020-09-03: 500 mg via ORAL
  Filled 2020-09-03: qty 1

## 2020-09-03 MED ORDER — ACETAMINOPHEN 325 MG PO TABS: 650.0000 mg | ORAL_TABLET | Freq: Four times a day (QID) | ORAL | Status: AC | PRN

## 2020-09-03 MED ORDER — OXYCODONE-ACETAMINOPHEN 5-325 MG PO TABS: 1.0000 | ORAL_TABLET | ORAL | 0 refills | Status: AC | PRN

## 2020-09-03 MED ORDER — LORATADINE 10 MG PO TABS: 10.0000 mg | ORAL_TABLET | Freq: Every day | ORAL | Status: AC

## 2020-09-03 MED ORDER — FLUTICASONE PROPIONATE 50 MCG/ACT NA SUSP
2.0000 | Freq: Every day | NASAL | 0 refills | Status: AC
  Filled 2020-09-03: qty 16, 30d supply, fill #0

## 2020-09-03 MED ORDER — MAGNESIUM SULFATE 4 GM/100ML IV SOLN
4.0000 g | Freq: Once | INTRAVENOUS | Status: AC
  Administered 2020-09-03: 4 g via INTRAVENOUS
  Filled 2020-09-03: qty 100

## 2020-09-03 MED ORDER — NICOTINE 21 MG/24HR TD PT24
21.0000 mg | MEDICATED_PATCH | Freq: Every day | TRANSDERMAL | Status: DC
  Administered 2020-09-03: 21 mg via TRANSDERMAL
  Filled 2020-09-03: qty 1

## 2020-09-03 MED ORDER — LORATADINE 10 MG PO TABS
10.0000 mg | ORAL_TABLET | Freq: Every day | ORAL | Status: DC
  Administered 2020-09-03: 10 mg via ORAL
  Filled 2020-09-03: qty 1

## 2020-09-03 MED ORDER — FLUTICASONE PROPIONATE 50 MCG/ACT NA SUSP
2.0000 | Freq: Every day | NASAL | Status: DC
  Administered 2020-09-03: 2 via NASAL
  Filled 2020-09-03: qty 16

## 2020-09-03 MED ORDER — NICOTINE 21 MG/24HR TD PT24: 21.0000 mg | MEDICATED_PATCH | Freq: Every day | TRANSDERMAL | 0 refills | Status: AC

## 2020-09-03 MED ORDER — GABAPENTIN 400 MG PO CAPS: 400.0000 mg | ORAL_CAPSULE | Freq: Three times a day (TID) | ORAL | 1 refills | Status: DC

## 2020-09-03 MED ORDER — CIPROFLOXACIN HCL 500 MG PO TABS
500.0000 mg | ORAL_TABLET | Freq: Two times a day (BID) | ORAL | 0 refills | Status: AC
  Filled 2020-09-03: qty 8, 4d supply, fill #0

## 2020-09-03 MED ORDER — ONDANSETRON HCL 4 MG PO TABS
4.0000 mg | ORAL_TABLET | Freq: Four times a day (QID) | ORAL | 0 refills | Status: AC | PRN
Start: 2020-09-03 — End: ?
  Filled 2020-09-03: qty 20, 5d supply, fill #0

## 2020-09-03 MED ORDER — POLYETHYLENE GLYCOL 3350 17 G PO PACK
17.0000 g | PACK | Freq: Every day | ORAL | 0 refills | Status: AC | PRN
  Filled 2020-09-03: qty 14, 14d supply, fill #0

## 2020-09-03 NOTE — TOC Progression Note (Signed)
Transition of Care [facility]) - Progression Note    Patient Details  Name: Dominique Bennett MRN: [id number] Date of Birth: Feb 05, 1986  Transition of Care [facility]) CM/SW Contact  Barrie Dunker, RN Phone Number: 09/03/2020, 3:47 PM  Clinical Narrative:   [facility] to verify that they are managing the patient's meds, They verified that they started with the patient end of March , They are going to get her medications ready and call me once completed to be picked up        Expected Discharge Plan and Services           Expected Discharge Date: 09/03/20                                     Social Determinants of Health (SDOH) Interventions    Readmission Risk Interventions No flowsheet data found.

## 2020-09-03 NOTE — Plan of Care (Signed)
Patient discharged home per MD orders at this time.All discharge instructions,education and medications reviewed with Pt.patient expressed understanding and will comply with dc instructions.Follow up appointments also communicated to patient.no verbal c/o or any ssx of distress.Patient transported home by family in a private car.

## 2020-09-03 NOTE — Progress Notes (Signed)
Discharge instructions reviewed with pt. TOC will pick up and bring patient her medication from [facility]. Prescriptions given to pt prior to discharge. Pt verbalized understanding of instructions and advised of appts

## 2020-09-03 NOTE — Progress Notes (Incomplete)
PROGRESS NOTE    Dominique Bennett  LDJ:[id number] DOB: 27-Dec-1985 DOA: 08/30/2020 PCP: Pcp, No (Confirm with patient/family/NH records and if not entered, this HAS to be entered at [facility] point of entry. "No PCP" if truly none.)   Chief Complaint  Patient presents with  . Flank Pain    Brief Narrative:  RobinMartinezis a 35 y.o.femaleG8 P0-4-3-4 currently [redacted] weeks pregnant by dateswith a known history ofanxiety, asthma, bipolar disorder, carpal tunnel syndrome, chronic back pain, depression, IBS, migraines, PTSD, sleep disorder and history of substance use disorderpresents to the emergency department for evaluation of left flank pain, dysuria and nausea.  History of recurrent kidney stones for many years.   Of note, at time of admission, denied any lower abdominal cramping, discharge, or vaginal bleeding. Reported recently establishing her obstetric care with [facility], Dr. Logan Bores.  Admitted for management of acute pyelonephritis.   On empiric Rocephin pending urine culture.  OB is consulted given U/S in ED concerning for spontaneous abortion.  Pt 10 weeks by dates, fetal size 6 weeks on U/S, and fetal heart tones identified.  Pt reports this would be her 4th loss of pregnancy.    Assessment & Plan:   Principal Problem:   Pyelonephritis Active Problems:   PTSD (post-traumatic stress disorder)   Opioid use disorder, moderate, dependence (HCC)   Bipolar disorder (HCC)   History of IBS   Missed abortion   Anxiety and depression   #1 acute pyelonephritis -Patient presented with complaints of dysuria, left flank pain, urinalysis concerning for UTI. -Renal ultrasound unremarkable. -Urine cultures with 50,000 colonies of lactobacillus species.  Sensitivities pending. -Patient still with significant pain. -Continue IV Rocephin pending sensitivities -IV fluids. -Then transition to p.o. meds with IV pain medication for breakthrough pain only. -IV antiemetics. -Add IV  Toradol. -Follow.  2.  Missed abortion -Patient seen in consultation by OB/GYN, Dr. Valentino Saxon, -Lupus anticoagulant ordered and pending. -hCG 17,270 >>> 12,302 -Patient for D and C today -Per OB/GYN.  3.  History of polysubstance abuse -Polysubstance abuse cessation. -TOC consulted for resources.  4.  Depression/anxiety/PTSD/bipolar disorder -Stable. -Continue home regimen of Prozac, Seroquel, trazodone. -It is noted that OB recommended weaning down all of her medications but patient feels they are required and helpful in avoiding substance abuse. -Outpatient follow-up.  5.  Obesity  6.  Borderline blood pressure -1 L normal saline bolus x1, then normal saline at 125 cc an hour. -Follow.  7.  History of IBS -DC Colace. -Senokot-S twice daily. -MiraLAX daily as needed.    DVT prophylaxis: SCDs Code Status: Full Family Communication: Updated patient.  No family at bedside. Disposition:   Status is: Inpatient    Dispo: The patient is from: Home              Anticipated d/c is to: Home              Patient currently admitted for acute pyelonephritis, on IV antibiotics, failed pregnancy for D and C today.  Not stable for discharge.   Difficult to place patient no       Consultants:   OB/GYN: Dr. Valentino Saxon 08/31/2020  Procedures:   Renal ultrasound 08/30/2020  D and C pending 09/02/2020  Antimicrobials:   IV Rocephin 08/31/2020>>>.   Subjective: Laying in bed.  Still with complaints of significant left flank pain.  Dysuria improved.  Suprapubic abdominal pain improved.  No nausea or vomiting.  No chest pain.  No shortness of breath. About to go to the OR  for D and C this morning.  Objective: Vitals:   09/02/20 1938 09/03/20 0011 09/03/20 0416 09/03/20 0737  BP: (!) 124/52 105/63 (!) 94/43 (!) 103/53  Pulse: 67 78 76 66  Resp: 17 14 16 15   Temp: 97.9 F (36.6 C) 97.6 F (36.4 C) 98.5 F (36.9 C) 98.1 F (36.7 C)  TempSrc: Oral Oral Oral Oral  SpO2: 99%  98% 96% 98%  Weight:      Height:        Intake/Output Summary (Last 24 hours) at 09/03/2020 1156 Last data filed at 09/03/2020 1016 Gross per 24 hour  Intake 5052.04 ml  Output 1100 ml  Net 3952.04 ml   Filed Weights   08/30/20 1349 08/30/20 1355  Weight: 86.2 kg 86.2 kg    Examination:  General exam: Appears calm and comfortable  Respiratory system: Clear to auscultation. Respiratory effort normal. Cardiovascular system: S1 & S2 heard, RRR. No JVD, murmurs, rubs, gallops or clicks. No pedal edema. Gastrointestinal system: Abdomen is nondistended, soft and nontender.  Positive left CVA TTP.  No organomegaly or masses felt. Normal bowel sounds heard. Central nervous system: Alert and oriented. No focal neurological deficits. Extremities: Symmetric 5 x 5 power. Skin: No rashes, lesions or ulcers Psychiatry: Judgement and insight appear normal. Mood & affect somewhat depressed.     Data Reviewed: I have personally reviewed following labs and imaging studies  CBC: Recent Labs  Lab 08/30/20 1400 08/31/20 0512 09/02/20 0448 09/03/20 0556  WBC 10.2 7.6 7.3 12.4*  NEUTROABS  --   --   --  9.1*  HGB 13.4 12.0 11.8* 9.4*  HCT 39.3 34.8* 34.0* 27.4*  MCV 99.2 98.9 98.8 99.6  PLT 290 244 237 221    Basic Metabolic Panel: Recent Labs  Lab 08/30/20 1400 08/31/20 0512 09/02/20 0448 09/03/20 0556 09/03/20 0557  NA 134* 136 137 137  --   K 3.3* 3.6 3.6 3.4*  --   CL 107 110 112* 111  --   CO2 20* 20* 20* 20*  --   GLUCOSE 97 89 101* 99  --   BUN 8 8 7 7   --   CREATININE 0.66 0.56 0.68 0.69  --   CALCIUM 8.7* 8.5* 8.6* 8.5*  --   MG  --   --  1.7  --  1.6*    GFR: Estimated Creatinine Clearance: 108.6 mL/min (by C-G formula based on SCr of 0.69 mg/dL).  Liver Function Tests: Recent Labs  Lab 08/31/20 0512  AST 13*  ALT 12  ALKPHOS 45  BILITOT 0.5  PROT 6.0*  ALBUMIN 3.2*    CBG: No results for input(s): GLUCAP in the last 168 hours.   Recent Results  (from the past 240 hour(s))  Urine culture     Status: Abnormal   Collection Time: 08/30/20  5:50 PM   Specimen: Urine, Random  Result Value Ref Range Status   Specimen Description   Final    URINE, RANDOM Performed at [facility], [address], [place], [address] [place]    Special Requests   Final    NONE Performed at [facility], [address] [place], [address] [place]    Culture (A)  Final    50,000 COLONIES/mL LACTOBACILLUS SPECIES Standardized susceptibility testing for this organism is not available. Performed at [facility], [address], [place], [address] [place]    Report Status 09/01/2020 FINAL  Final  SARS CORONAVIRUS 2 (TAT 6-24 HRS) Nasopharyngeal  Nasopharyngeal Swab     Status: None   Collection Time: 08/30/20  7:53 PM   Specimen: Nasopharyngeal Swab  Result Value Ref Range Status   SARS Coronavirus 2 NEGATIVE NEGATIVE Final    Comment: (NOTE) SARS-CoV-2 target nucleic acids are NOT DETECTED.  The SARS-CoV-2 RNA is generally detectable in upper and lower respiratory specimens during the acute phase of infection. Negative results do not preclude SARS-CoV-2 infection, do not rule out co-infections with other pathogens, and should not be used as the sole basis for treatment or other patient management decisions. Negative results must be combined with clinical observations, patient history, and epidemiological information. The expected result is Negative.  Fact Sheet for Patients: HairSlick.no  Fact Sheet for Healthcare Providers: quierodirigir.com  This test is not yet approved or cleared by the [place] FDA and  has been authorized for detection and/or diagnosis of SARS-CoV-2 by FDA under an Emergency Use Authorization (EUA). This EUA will remain  in effect (meaning this test can be used) for the duration of the COVID-19 declaration under Se ction 564(b)(1) of the  Act, 21 U.S.C. section 360bbb-3(b)(1), unless the authorization is terminated or revoked sooner.  Performed at [facility], [address], [place], [place] [address]          Radiology Studies: No results found.      Scheduled Meds: . busPIRone  15 mg Oral TID  . FLUoxetine  40 mg Oral Daily  . gabapentin  400 mg Oral TID  . potassium chloride  40 mEq Oral Once  . QUEtiapine  150 mg Oral QAC breakfast  . QUEtiapine  450 mg Oral QHS  . senna-docusate  1 tablet Oral BID   Continuous Infusions: . sodium chloride 125 mL/hr at 09/03/20 0529  . cefTRIAXone (ROCEPHIN)  IV Stopped (09/02/20 1738)  . magnesium sulfate bolus IVPB 4 g (09/03/20 1102)     LOS: 4 days    Time spent: 35 minutes    Ramiro Harvest, MD [facility]   To contact the attending provider between 7A-7P or the covering provider during after hours 7P-7A, please log into the web site [email or web address] and access using universal [place] password for that web site. If you do not have the password, please call the hospital operator.  09/03/2020, 11:56 AM

## 2020-09-03 NOTE — Discharge Summary (Signed)
Physician Discharge Summary  Dominique Bennett UUV:[id number] DOB: [birth year] DOA: 08/30/2020  PCP: Pcp, No  Admit date: 08/30/2020 Discharge date: 09/03/2020  Time spent: 50 minutes  Recommendations for Outpatient Follow-up:  1. Follow-up with PCP in 2 weeks.  On follow-up patient's pyelonephritis will need to be reassessed. 2. Follow-up with Dr. Valentino Saxon in 2 weeks.   Discharge Diagnoses:  Principal Problem:   Pyelonephritis Active Problems:   PTSD (post-traumatic stress disorder)   Opioid use disorder, moderate, dependence (HCC)   Bipolar disorder (HCC)   History of IBS   Missed abortion   Anxiety and depression   Discharge Condition: Stable and improved  Diet recommendation: Regular  Filed Weights   08/30/20 1349 08/30/20 1355  Weight: 86.2 kg 86.2 kg    History of present illness:  HPI per Dr. Emmit Pomfret Dominique Bennett is a 35 y.o. female G8 P0-4-3-4 currently [redacted] weeks pregnant by dates with a known history of anxiety, asthma, bipolar disorder, carpal tunnel syndrome, chronic back pain, depression, IBS, migraines, PTSD, sleep disorder and history of substance use disorder presents to the emergency department for evaluation of left flank pain.  Patient was in a usual state of health until 1 night prior to arrival in the emergency department she developed sharp constant left flank pain associated with dysuria, nausea.  She does have a history of kidney stones.  Of note she denies any lower abdominal cramping, discharge, vaginal bleeding.  She recently established her [facility] care with [facility], Dr. Logan Bores.  Patient denies fevers/chills, weakness, dizziness, chest pain, shortness of breath, N/V/C/D, abdominal pain, dysuria/frequency, changes in mental status.    Otherwise there has been no change in status. Patient has been taking medication as prescribed and there has been no recent change in medication or diet.  No recent antibiotics.  There has been no recent illness,  hospitalizations, travel or sick contacts.    EMS/ED Course: Patient received Rocephin, LR, morphine, Zofran. Medical admission has been requested for further management of pyelonephritis.  Hospital Course:  1 acute pyelonephritis -Patient presented with complaints of dysuria, left flank pain, urinalysis concerning for UTI. -Renal ultrasound unremarkable. -Urine cultures with 50,000 colonies of lactobacillus species.  -Patient placed empirically on IV Rocephin as well as IV pain medications -Patient transition to oral pain medications. -Patient noted to have some CVA tenderness however stated was able to manage at home. -Patient remained afebrile -Patient was discharged home on 4 days of oral ciprofloxacin to complete a 7-day course of treatment, Percocet for 15 tablets prescribed for patient for acute pain. -Outpatient follow-up with PCP.  2.  Missed abortion -Patient seen in consultation by OB/GYN, Dr. Valentino Saxon, -Lupus anticoagulant ordered and pending. -hCG 17,270 >>> 12,302 -Patient S/P D and C Per OB/GYN on 09/02/2020. -Outpatient follow-up with OB/GYN.  3.  History of polysubstance abuse -Patient congratulated on ongoing polysubstance abuse cessation.   -TOC consulted for resources which was given to patient.   4.  Depression/anxiety/PTSD/bipolar disorder -Stable. -Patient placed back on home regimen of Prozac, Seroquel, trazodone.   -It is noted that OB recommended weaning down all of her medications but patient feels they are required and helpful in avoiding substance abuse. -Outpatient follow-up.  5.  Obesity  6.  Borderline blood pressure - patient hydrated with IV fluids.   -Patient remained asymptomatic.  7.  History of IBS -Patient was placed on a stool softener as well as MiraLAX as needed.   -Outpatient follow-up.    Procedures:  Renal ultrasound 08/30/2020  D  and C 09/02/2020   Consultations:  OB/GYN: Dr. Valentino Saxon 08/31/2020    Discharge  Exam: Vitals:   09/03/20 0416 09/03/20 0737  BP: (!) 94/43 (!) 103/53  Pulse: 76 66  Resp: 16 15  Temp: 98.5 F (36.9 C) 98.1 F (36.7 C)  SpO2: 96% 98%    General: NAD Cardiovascular: RRR Respiratory: CTAB  Discharge Instructions   Discharge Instructions    Diet general   Complete by: As directed    Discharge wound care:   Complete by: As directed    As per OBGYN   Increase activity slowly   Complete by: As directed      Allergies as of 09/03/2020      Reactions   Duloxetine Other (See Comments)   Suicidal Patient states it causes her to think about suicide   Egg Shells Anaphylaxis   Ibuprofen Other (See Comments)   "kidneys start to shut down"   Tramadol Other (See Comments)   Paralysis of bladder   Fluoxetine Rash   Cymbalta [duloxetine Hcl] Other (See Comments)   Altered mental status   Other Hives   Flu vaccine   Latex Rash   Nickel Itching, Rash      Medication List    TAKE these medications   acetaminophen 325 MG tablet Commonly known as: TYLENOL Take 2 tablets (650 mg total) by mouth every 6 (six) hours as needed for mild pain (or Fever >/= 101).   busPIRone 15 MG tablet Commonly known as: BUSPAR TAKE ONE TABLET BY MOUTH 3 TIMES A DAY   ciprofloxacin 500 MG tablet Commonly known as: CIPRO Take 1 tablet (500 mg total) by mouth 2 (two) times daily for 4 days.   docusate sodium 100 MG capsule Commonly known as: Colace Take 1 capsule (100 mg total) by mouth 2 (two) times daily.   FLUoxetine 40 MG capsule Commonly known as: PROZAC TAKE ONE CAPSULE BY MOUTH EVERY DAY   fluticasone 50 MCG/ACT nasal spray Commonly known as: FLONASE Place 2 sprays into both nostrils daily. Start taking on: Sep 04, 2020   gabapentin 400 MG capsule Commonly known as: NEURONTIN Take 1 capsule (400 mg total) by mouth 3 (three) times daily.   loratadine 10 MG tablet Commonly known as: CLARITIN Take 1 tablet (10 mg total) by mouth daily. Start taking on: Sep 04, 2020   multivitamin-prenatal 27-0.8 MG Tabs tablet Take 1 tablet by mouth daily at 12 noon.   nicotine 21 mg/24hr patch Commonly known as: NICODERM CQ - dosed in mg/24 hours Place 1 patch (21 mg total) onto the skin daily. Start taking on: Sep 04, 2020   ondansetron 4 MG tablet Commonly known as: ZOFRAN Take 1 tablet (4 mg total) by mouth every 6 (six) hours as needed for nausea.   oxyCODONE-acetaminophen 5-325 MG tablet Commonly known as: PERCOCET/ROXICET Take 1-2 tablets by mouth every 4 (four) hours as needed for moderate pain or severe pain.   polyethylene glycol 17 g packet Commonly known as: MIRALAX / GLYCOLAX Take 17 g by mouth daily as needed for moderate constipation.   QUEtiapine 100 MG tablet Commonly known as: SEROQUEL TAKE 1 AND 1/2 TABLETS (150 MG) BY MOUTH EVERY MORNING AND TAKE 4.5 TABLETS (450 MG) BY MOUTHAT BEDTIME   traZODone 100 MG tablet Commonly known as: DESYREL TAKE ONE TABLET BY MOUTH AT BEDTIME AS NEEDED FOR SLEEP What changed: how much to take            Discharge Care Instructions  (  From admission, onward)         Start     Ordered   09/03/20 0000  Discharge wound care:       Comments: As per OBGYN   09/03/20 1534         Allergies  Allergen Reactions  . Duloxetine Other (See Comments)    Suicidal Patient states it causes her to think about suicide   . Egg Shells Anaphylaxis  . Ibuprofen Other (See Comments)    "kidneys start to shut down"  . Tramadol Other (See Comments)    Paralysis of bladder  . Fluoxetine Rash  . Cymbalta [Duloxetine Hcl] Other (See Comments)    Altered mental status  . Other Hives    Flu vaccine  . Latex Rash  . Nickel Itching and Rash    Follow-up Information    Hildred Laserherry, Anika, MD Follow up in 2 week(s).   Specialties: Obstetrics and Gynecology, Radiology Why: Your appointment has been moved to Tuesday, March 17, at 1:30 p.m. Contact information: [address] [place]  [id number] [phone]        PCP. Schedule an appointment as soon as possible for a visit in 2 week(s).   Why: f/u in 1-2 weeks.               The results of significant diagnostics from this hospitalization (including imaging, microbiology, ancillary and laboratory) are listed below for reference.    Significant Diagnostic Studies: US Renal  Result Date: 08/30/2020 CLINICAL DATA:  Flank pain EXAM: RENAL / URINARY TRACT ULTRASOUND COMPLETE COMPARISON:  CT abdomen 02/16/2014 FINDINGS: Right Kidney: Renal measurements: 11 x 6.8 x 5 cm = volume: 195.6 mL. Echogenicity within normal limits. No mass or hydronephrosis visualized. Left Kidney: Renal measurements: 11 x 5.6 x 5.1 cm = volume: 166.2 mL. Echogenicity within normal limits. No mass or hydronephrosis visualized. Bladder: Appears normal for degree of bladder distention. Other: None. IMPRESSION: 1. Normal renal ultrasound. Electronically Signed   By: Elige KoHetal  Patel   On: 08/30/2020 19:11   US OB LESS THAN 14 WEEKS WITH OB TRANSVAGINAL  Result Date: 08/30/2020 CLINICAL DATA:  Initial evaluation for acute left adnexal pain for 1 day. Early pregnancy. EXAM: OBSTETRIC <14 WK US AND TRANSVAGINAL OB US TECHNIQUE: Both transabdominal and transvaginal ultrasound examinations were performed for complete evaluation of the gestation as well as the maternal uterus, adnexal regions, and pelvic cul-de-sac. Transvaginal technique was performed to assess early pregnancy. COMPARISON:  None available. FINDINGS: Intrauterine gestational sac: Single Yolk sac:  Negative. Embryo:  Present. Cardiac Activity: Negative. Heart Rate: N/A CRL: 3.5 mm   6 w   0 d                  US EDC: 04/25/2021 Subchorionic hemorrhage: Small subchorionic hemorrhage without mass effect. Maternal uterus/adnexae: Ovaries within normal limits bilaterally. 1.8 cm degenerating corpus luteal cyst noted within the left ovary. No adnexal mass. Small volume free fluid present within the pelvis.  IMPRESSION: 1. Single intrauterine pregnancy with internal embryo, but no detectable cardiac activity. Crown-rump length measures 3.5 mm. Findings are suspicious but not yet definitive for failed pregnancy. Recommend follow-up US in 10-14 days for definitive diagnosis. This recommendation follows SRU consensus guidelines: Diagnostic Criteria for Nonviable Pregnancy Early in the First Trimester. Malva Limes Engl J Med 2013; 914:7829-56; 369:1443-51. 2. Small subchorionic hemorrhage without mass effect. 3. 1.8 cm degenerating left ovarian corpus luteal cyst with small volume free fluid in the pelvis.  Finding could contribute to acute left adnexal pain. Electronically Signed   By: Rise Mu M.D.   On: 08/30/2020 19:36    Microbiology: Recent Results (from the past 240 hour(s))  Urine culture     Status: Abnormal   Collection Time: 08/30/20  5:50 PM   Specimen: Urine, Random  Result Value Ref Range Status   Specimen Description   Final    URINE, RANDOM Performed at [facility], [address] [place], [place] [address]    Special Requests   Final    NONE Performed at [facility], [address] [place], [place] [address]    Culture (A)  Final    50,000 COLONIES/mL LACTOBACILLUS SPECIES Standardized susceptibility testing for this organism is not available. Performed at [facility], [address], [place], [place] [address]    Report Status 09/01/2020 FINAL  Final  SARS CORONAVIRUS 2 (TAT 6-24 HRS) Nasopharyngeal Nasopharyngeal Swab     Status: None   Collection Time: 08/30/20  7:53 PM   Specimen: Nasopharyngeal Swab  Result Value Ref Range Status   SARS Coronavirus 2 NEGATIVE NEGATIVE Final    Comment: (NOTE) SARS-CoV-2 target nucleic acids are NOT DETECTED.  The SARS-CoV-2 RNA is generally detectable in upper and lower respiratory specimens during the acute phase of infection. Negative results do not preclude SARS-CoV-2 infection, do not rule out co-infections with  other pathogens, and should not be used as the sole basis for treatment or other patient management decisions. Negative results must be combined with clinical observations, patient history, and epidemiological information. The expected result is Negative.  Fact Sheet for Patients: HairSlick.no  Fact Sheet for Healthcare Providers: quierodirigir.com  This test is not yet approved or cleared by the [place] FDA and  has been authorized for detection and/or diagnosis of SARS-CoV-2 by FDA under an Emergency Use Authorization (EUA). This EUA will remain  in effect (meaning this test can be used) for the duration of the COVID-19 declaration under Se ction 564(b)(1) of the Act, 21 U.S.C. section 360bbb-3(b)(1), unless the authorization is terminated or revoked sooner.  Performed at [facility], [address] [place], [place] [address]      Labs: Basic Metabolic Panel: Recent Labs  Lab 08/30/20 1400 08/31/20 0512 09/02/20 0448 09/03/20 0556 09/03/20 0557  NA 134* 136 137 137  --   K 3.3* 3.6 3.6 3.4*  --   CL 107 110 112* 111  --   CO2 20* 20* 20* 20*  --   GLUCOSE 97 89 101* 99  --   BUN 8 8 7 7   --   CREATININE 0.66 0.56 0.68 0.69  --   CALCIUM 8.7* 8.5* 8.6* 8.5*  --   MG  --   --  1.7  --  1.6*   Liver Function Tests: Recent Labs  Lab 08/31/20 0512  AST 13*  ALT 12  ALKPHOS 45  BILITOT 0.5  PROT 6.0*  ALBUMIN 3.2*   No results for input(s): LIPASE, AMYLASE in the last 168 hours. No results for input(s): AMMONIA in the last 168 hours. CBC: Recent Labs  Lab 08/30/20 1400 08/31/20 0512 09/02/20 0448 09/03/20 0556  WBC 10.2 7.6 7.3 12.4*  NEUTROABS  --   --   --  9.1*  HGB 13.4 12.0 11.8* 9.4*  HCT 39.3 34.8* 34.0* 27.4*  MCV 99.2 98.9 98.8 99.6  PLT 290 244 237 221   Cardiac Enzymes: No results for input(s):  CKTOTAL, CKMB, CKMBINDEX, TROPONINI in the last 168 hours. BNP: BNP (last 3  results) No results for input(s): BNP in the last 8760 hours.  ProBNP (last 3 results) No results for input(s): PROBNP in the last 8760 hours.  CBG: No results for input(s): GLUCAP in the last 168 hours.     Signed:  Ramiro Harvest MD.  [facility] 09/03/2020, 3:35 PM

## 2020-09-03 NOTE — Progress Notes (Signed)
Hospital Day # 4, admitted for pyelonephritis.  POD#1 s/p Suction D&C for missed abortion at 6 weeks pregnancy.  Also with a h/o bipolar disorder, depression, PTSD, asthma, history of substance use, IBS-C, and recurrent kidney stones.   Subjective: Reports that her bleeding is like a period.  States that her pain was "really bad" last night, not helped with the Percocet and Morphine (but thinks this was due to an interruption in her medication schedule with shift and personnel changes). Tolerating diet, desires to ambulate and shower..  Denies fevers, or chills.   Objective: Vitals:   09/02/20 1938 09/03/20 0011 09/03/20 0416 09/03/20 0737  BP: (!) 124/52 105/63 (!) 94/43 (!) 103/53  Pulse: 67 78 76 66  Resp: 17 14 16 15   Temp: 97.9 F (36.6 C) 97.6 F (36.4 C) 98.5 F (36.9 C) 98.1 F (36.7 C)  TempSrc: Oral Oral Oral Oral  SpO2: 99% 98% 96% 98%  Weight:      Height:         Scheduled Meds: . busPIRone  15 mg Oral TID  . FLUoxetine  40 mg Oral Daily  . gabapentin  400 mg Oral TID  . QUEtiapine  150 mg Oral QAC breakfast  . QUEtiapine  450 mg Oral QHS  . senna-docusate  1 tablet Oral BID   Continuous Infusions: . sodium chloride 125 mL/hr at 09/03/20 0529  . cefTRIAXone (ROCEPHIN)  IV Stopped (09/02/20 1738)  . magnesium sulfate bolus IVPB 4 g (09/03/20 1102)    PRN Meds:.acetaminophen **OR** acetaminophen, albuterol, bisacodyl, diphenhydrAMINE, ipratropium, magnesium citrate, morphine injection, ondansetron **OR** ondansetron (ZOFRAN) IV, oxyCODONE-acetaminophen, polyethylene glycol, senna-docusate, traZODone  Physical Exam:  General: alert and no distress  Lungs: clear to auscultation bilaterally Heart: regular rate and rhythm, S1, S2 normal, no murmur, click, rub or gallop Abdomen: soft, non-tender; bowel sounds normal; no masses,  no organomegaly Pelvis:Bleeding: None Extremities: DVT Evaluation: No evidence of DVT seen on physical exam. Negative Homan's sign.  No  cords or calf tenderness. No significant calf/ankle edema.  SCDs on.     CBC Latest Ref Rng & Units 09/03/2020 09/02/2020 08/31/2020  WBC 4.0 - 10.5 K/uL 12.4(H) 7.3 7.6  Hemoglobin 12.0 - 15.0 g/dL 09/02/2020) 11.8(L) 12.0  Hematocrit 36.0 - 46.0 % 27.4(L) 34.0(L) 34.8(L)  Platelets 150 - 400 K/uL 221 237 244      CMP Latest Ref Rng & Units 09/03/2020 09/02/2020 08/31/2020  Glucose 70 - 99 mg/dL 99 09/02/2020) 89  BUN 6 - 20 mg/dL 7 7 8   Creatinine 0.44 - 1.00 mg/dL 951(O 8.41  Sodium 135 - 145 mmol/L 137 137 136  Potassium 3.5 - 5.1 mmol/L 3.4(L) 3.6 3.6  Chloride 98 - 111 mmol/L 111 112(H) 110  CO2 22 - 32 mmol/L 20(L) 20(L) 20(L)  Calcium 8.9 - 10.3 mg/dL 6.60) 6.30) 1.6(W)  Total Protein 6.5 - 8.1 g/dL - - 6.0(L)  Total Bilirubin 0.3 - 1.2 mg/dL - - 0.5  Alkaline Phos 38 - 126 U/L - - 45  AST 15 - 41 U/L - - 13(L)  ALT 0 - 44 U/L - - 12    .  Results for Dominique Bennett, Dominique Bennett (MRN 3.2(T) as of 09/01/2020 15:31  Ref. Range 08/30/2020 17:50 08/31/2020 05:12 09/01/2020 11:52  HCG, Beta Chain, Quant, S Latest Ref Range: <5 mIU/mL 17,270 (H)  12,302 (H)    Assessment/Plan: 1. Pyelonephritis - Currently being managed by primary team (Hospitalists).  Patient had been receiving IV antibiotics (Ceftriaxone) however urine culture  is overall negative (Lactobacillus). Can likely discontinue at this time.   2. Missed abortion - Is now Day #1 s/p D&C. Doing well post-operatively. Desires to ambulate and shower. Will place order. Offered counseling or chaplain services, patient declines. States that if she is able to be discharged today she will go to [facility] clinic as they accept walk-in's. Also needs to establish care with a therapist so that her mental health medications can be refilled (relocated to [place] from [place] in January, where she was previously receiving her prescriptions).   3. H/o bipolar disorder, continue medications. Plans to f/u outpatient with a counselor.   4. H/o IBS, continue bowel  regimen with Colace while inpatient.    Patient from OB/GYN standpoint is stable. No need for further follow up. Will arrange for patient to f/ uin 2 weeks for a post-op check in my office.  Please feel free to re-consult if any new concerns arise.   Hildred Laser, MD [facility]

## 2020-09-17 ENCOUNTER — Encounter: Payer: Self-pay | Admitting: Obstetrics and Gynecology

## 2020-09-17 ENCOUNTER — Encounter: Payer: Self-pay | Admitting: Certified Nurse Midwife

## 2020-09-17 ENCOUNTER — Encounter: Payer: Medicaid Other | Admitting: Obstetrics and Gynecology

## 2020-09-18 ENCOUNTER — Ambulatory Visit: Payer: BLUE CROSS/BLUE SHIELD

## 2020-09-22 ENCOUNTER — Other Ambulatory Visit: Payer: Self-pay

## 2020-09-22 ENCOUNTER — Encounter: Payer: Self-pay | Admitting: Emergency Medicine

## 2020-09-22 ENCOUNTER — Emergency Department
Admission: EM | Admit: 2020-09-22 | Discharge: 2020-09-23 | Disposition: A | Discharge status: Home / Self Care | Payer: BLUE CROSS/BLUE SHIELD | Attending: Emergency Medicine | Admitting: Emergency Medicine

## 2020-09-22 DIAGNOSIS — Z7952 Long term (current) use of systemic steroids: Secondary | ICD-10-CM | POA: Diagnosis not present

## 2020-09-22 DIAGNOSIS — F32A Depression, unspecified: Secondary | ICD-10-CM | POA: Diagnosis present

## 2020-09-22 DIAGNOSIS — F419 Anxiety disorder, unspecified: Secondary | ICD-10-CM | POA: Diagnosis not present

## 2020-09-22 DIAGNOSIS — F4310 Post-traumatic stress disorder, unspecified: Secondary | ICD-10-CM | POA: Diagnosis not present

## 2020-09-22 DIAGNOSIS — F3160 Bipolar disorder, current episode mixed, unspecified: Secondary | ICD-10-CM

## 2020-09-22 DIAGNOSIS — J45909 Unspecified asthma, uncomplicated: Secondary | ICD-10-CM | POA: Insufficient documentation

## 2020-09-22 DIAGNOSIS — Z046 Encounter for general psychiatric examination, requested by authority: Secondary | ICD-10-CM | POA: Diagnosis present

## 2020-09-22 DIAGNOSIS — F418 Other specified anxiety disorders: Secondary | ICD-10-CM | POA: Diagnosis not present

## 2020-09-22 DIAGNOSIS — R8271 Bacteriuria: Secondary | ICD-10-CM

## 2020-09-22 DIAGNOSIS — U071 COVID-19: Secondary | ICD-10-CM | POA: Diagnosis not present

## 2020-09-22 DIAGNOSIS — F17210 Nicotine dependence, cigarettes, uncomplicated: Secondary | ICD-10-CM | POA: Diagnosis not present

## 2020-09-22 DIAGNOSIS — F339 Major depressive disorder, recurrent, unspecified: Secondary | ICD-10-CM

## 2020-09-22 DIAGNOSIS — F1120 Opioid dependence, uncomplicated: Secondary | ICD-10-CM | POA: Diagnosis not present

## 2020-09-22 DIAGNOSIS — Z91040 Latex allergy status: Secondary | ICD-10-CM | POA: Insufficient documentation

## 2020-09-22 DIAGNOSIS — F319 Bipolar disorder, unspecified: Secondary | ICD-10-CM | POA: Diagnosis present

## 2020-09-22 LAB — CBC
HCT: 35.3 % — ABNORMAL LOW (ref 36.0–46.0)
Hemoglobin: 12.3 g/dL (ref 12.0–15.0)
MCH: 34 pg (ref 26.0–34.0)
MCHC: 34.8 g/dL (ref 30.0–36.0)
MCV: 97.5 fL (ref 80.0–100.0)
Platelets: 232 10*3/uL (ref 150–400)
RBC: 3.62 MIL/uL — ABNORMAL LOW (ref 3.87–5.11)
RDW: 12.6 % (ref 11.5–15.5)
WBC: 5.5 10*3/uL (ref 4.0–10.5)
nRBC: 0 % (ref 0.0–0.2)

## 2020-09-22 LAB — COMPREHENSIVE METABOLIC PANEL
ALT: 19 U/L (ref 0–44)
AST: 25 U/L (ref 15–41)
Albumin: 3.5 g/dL (ref 3.5–5.0)
Alkaline Phosphatase: 59 U/L (ref 38–126)
Anion gap: 8 (ref 5–15)
BUN: <5 mg/dL — ABNORMAL LOW (ref 6–20)
CO2: 21 mmol/L — ABNORMAL LOW (ref 22–32)
Calcium: 8.4 mg/dL — ABNORMAL LOW (ref 8.9–10.3)
Chloride: 110 mmol/L (ref 98–111)
Creatinine, Ser: 0.84 mg/dL (ref 0.44–1.00)
GFR, Estimated: >60 mL/min (ref >60)
Glucose, Bld: 95 mg/dL (ref 70–99)
Potassium: 3.1 mmol/L — ABNORMAL LOW (ref 3.5–5.1)
Sodium: 139 mmol/L (ref 135–145)
Total Bilirubin: 0.5 mg/dL (ref 0.3–1.2)
Total Protein: 6.5 g/dL (ref 6.5–8.1)

## 2020-09-22 LAB — URINE DRUG SCREEN, QUALITATIVE (ARMC ONLY)
Amphetamines, Ur Screen: NOT DETECTED
Barbiturates, Ur Screen: NOT DETECTED
Benzodiazepine, Ur Scrn: NOT DETECTED
Cannabinoid 50 Ng, Ur ~~LOC~~: NOT DETECTED
Cocaine Metabolite,Ur ~~LOC~~: NOT DETECTED
MDMA (Ecstasy)Ur Screen: NOT DETECTED
Methadone Scn, Ur: NOT DETECTED
Opiate, Ur Screen: NOT DETECTED
Phencyclidine (PCP) Ur S: NOT DETECTED
Tricyclic, Ur Screen: POSITIVE — AB

## 2020-09-22 LAB — ETHANOL: Alcohol, Ethyl (B): <10 mg/dL (ref <10)

## 2020-09-22 LAB — POC URINE PREG, ED: Preg Test, Ur: POSITIVE — AB

## 2020-09-22 LAB — SALICYLATE LEVEL: Salicylate Lvl: <7 mg/dL — ABNORMAL LOW (ref 7.0–30.0)

## 2020-09-22 LAB — ACETAMINOPHEN LEVEL: Acetaminophen (Tylenol), Serum: <10 ug/mL — ABNORMAL LOW (ref 10–30)

## 2020-09-22 NOTE — ED Triage Notes (Signed)
Pt brought in from recovery house stating that she just want to get high or harm herself. "Iwant to be numb, I dont want to feel this pain anymore." She got anxiety and called her sponsor and they had her come here. Pt tearful in triage

## 2020-09-22 NOTE — ED Notes (Signed)
Up to bathroom to attempt to provide urine sample.

## 2020-09-22 NOTE — ED Provider Notes (Signed)
[facility] Emergency Department Provider Note   ____________________________________________   I have reviewed the triage vital signs and the nursing notes.   HISTORY  Chief Complaint Psychiatric Evaluation   History limited by: Not Limited   HPI Dominique Bennett is a 35 y.o. female who presents to the emergency department today because of concerns for depression and desire to use substances.  Patient states she has a history of narcotic abuse in the past.  She does go to [facility].  She states the past few days have been tough as it is been an anniversary of when her child was taken away from her.  Patient states that she was thinking about using drugs and also having some thoughts about suicidal ideation.  She states that she just did not want to have to think anymore.  The patient realized that she was having difficulty and contacted her sponsor.     Records reviewed. Per medical record review patient has a history of depression, substance use disorder.  Past Medical History:  Diagnosis Date  . Anxiety   . Asthma   . Bipolar 1 disorder (HCC)   . Carpal tunnel syndrome   . Chronic back pain   . Depression   . IBS (irritable bowel syndrome)   . Migraines   . PTSD (post-traumatic stress disorder)   . Sleep disorder   . Substance use disorder     Patient Active Problem List   Diagnosis Date Noted  . Anxiety and depression 09/02/2020  . Missed abortion 09/01/2020  . Pyelonephritis 08/30/2020  . Pregnancy 08/15/2020  . Chronic back pain 07/31/2020  . Back pain 07/31/2020  . Numbness and tingling in both hands 07/31/2020  . History of IBS 07/31/2020  . Bipolar affective disorder (HCC)   . Hematuria   . PTSD (post-traumatic stress disorder) 10/01/2014  . Opioid use disorder, moderate, dependence (HCC) 10/01/2014  . 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 Bipolar disorder (HCC) 10/01/2014    Past Surgical  History:  Procedure Laterality Date  . CESAREAN SECTION    . DILATION AND EVACUATION N/A 09/02/2020   Procedure: DILATATION AND EVACUATION;  Surgeon: Hildred Laser, MD;  Location: [facility] ORS;  Service: Gynecology;  Laterality: N/A;  . LITHOTRIPSY    . TONSILLECTOMY      Prior to Admission medications   Medication Sig Start Date End Date Taking? Authorizing Provider  acetaminophen (TYLENOL) 325 MG tablet Take 2 tablets (650 mg total) by mouth every 6 (six) hours as needed for mild pain (or Fever >/= 101). 09/03/20  Yes Rodolph Bong, MD  busPIRone (BUSPAR) 15 MG tablet TAKE ONE TABLET BY MOUTH 3 TIMES A DAY 07/31/20 10/01/20 Yes Iloabachie, Chioma E, NP  docusate sodium (COLACE) 100 MG capsule Take 1 capsule (100 mg total) by mouth 2 (two) times daily. 07/31/20  Yes Iloabachie, Chioma E, NP  FLUoxetine (PROZAC) 40 MG capsule TAKE ONE CAPSULE BY MOUTH EVERY DAY 07/31/20 07/31/21 Yes Iloabachie, Chioma E, NP  fluticasone (FLONASE) 50 MCG/ACT nasal spray Spray 2 sprays into both nostrils once daily. 09/04/20  Yes Rodolph Bong, MD  gabapentin (NEURONTIN) 400 MG capsule Take 1 capsule (400 mg total) by mouth 3 (three) times daily. 09/03/20  Yes Rodolph Bong, MD  loratadine (CLARITIN) 10 MG tablet Take 1 tablet (10 mg total) by mouth daily. 09/04/20  Yes Rodolph Bong, MD  nicotine (NICODERM CQ - DOSED IN MG/24 HOURS) 21 mg/24hr patch Place  1 patch (21 mg total) onto the skin daily. 09/04/20  Yes Rodolph Bong, MD  ondansetron (ZOFRAN) 4 MG tablet Take 1 tablet (4 mg total) by mouth every 6 (six) hours as needed for nausea. 09/03/20  Yes Rodolph Bong, MD  oxyCODONE-acetaminophen (PERCOCET/ROXICET) 5-325 MG tablet Take 1-2 tablets by mouth every 4 (four) hours as needed for moderate pain or severe pain. 09/03/20  Yes Rodolph Bong, MD  polyethylene glycol (MIRALAX / GLYCOLAX) 17 g packet Mix with 8oz of water and take 17 g (1packet) by mouth daily as needed for moderate constipation.  09/03/20  Yes Rodolph Bong, MD  Prenatal Vit-Fe Fumarate-FA (MULTIVITAMIN-PRENATAL) 27-0.8 MG TABS tablet Take 1 tablet by mouth daily at 12 noon. 08/07/20 11/15/20 Yes Federico Flake, MD  QUEtiapine (SEROQUEL) 100 MG tablet TAKE 1 AND 1/2 TABLETS (150 MG) BY MOUTH EVERY MORNING AND TAKE 4.5 TABLETS (450 MG) BY MOUTHAT BEDTIME 07/31/20 07/31/21 Yes Iloabachie, Chioma E, NP  traZODone (DESYREL) 100 MG tablet TAKE ONE TABLET BY MOUTH AT BEDTIME AS NEEDED FOR SLEEP Patient taking differently: Take 100 mg by mouth at bedtime as needed. for sleep 07/31/20 07/31/21 Yes Iloabachie, Chioma E, NP    Allergies Duloxetine, Egg shells, Ibuprofen, Tramadol, Fluoxetine, Cymbalta [duloxetine hcl], Other, Latex, and Nickel  No family history on file.  Social History Social History   Tobacco Use  . Smoking status: Current Every Day Smoker    Packs/day: 0.50    Types: Cigarettes  . Smokeless tobacco: Never Used  Vaping Use  . Vaping Use: Some days  . Substances: Nicotine  Substance Use Topics  . Alcohol use: Not Currently  . Drug use: Not Currently    Types: "Crack" cocaine    Comment: 05/04/2020- last use-alcohol, crack, hx heroin    Review of Systems Constitutional: No fever/chills Eyes: No visual changes. ENT: No sore throat. Cardiovascular: Denies chest pain. Respiratory: Denies shortness of breath. Gastrointestinal:  No nausea, no vomiting.  No diarrhea.   Genitourinary: Negative for dysuria. Musculoskeletal: Negative for back pain. Skin: Negative for rash. Neurological: Negative for headaches, focal weakness or numbness.  ____________________________________________   PHYSICAL EXAM:  VITAL SIGNS: ED Triage Vitals  Enc Vitals Group     BP 09/22/20 2115 115/89     Pulse Rate 09/22/20 2115 82     Resp 09/22/20 2115 18     Temp 09/22/20 2115 98.7 F (37.1 C)     Temp Source 09/22/20 2115 Oral     SpO2 09/22/20 2115 97 %     Weight 09/22/20 2117 185 lb (83.9 kg)     Height  09/22/20 2117 5\' 6"  (1.676 m)     Head Circumference --      Peak Flow --      Pain Score 09/22/20 2116 9   Constitutional: Alert and oriented.  Eyes: Conjunctivae are normal.  ENT      Head: Normocephalic and atraumatic.      Nose: No congestion/rhinnorhea.      Mouth/Throat: Mucous membranes are moist.      Neck: No stridor. Hematological/Lymphatic/Immunilogical: No cervical lymphadenopathy. Cardiovascular: Normal rate, regular rhythm.  No murmurs, rubs, or gallops.  Respiratory: Normal respiratory effort without tachypnea nor retractions. Breath sounds are clear and equal bilaterally. No wheezes/rales/rhonchi. Gastrointestinal: Soft and non tender. No rebound. No guarding.  Genitourinary: Deferred Musculoskeletal: Normal range of motion in all extremities. No lower extremity edema. Neurologic:  Normal speech and language. No gross focal neurologic deficits are  appreciated.  Skin:  Skin is warm, dry and intact. No rash noted. Psychiatric: Depressed.  ____________________________________________    LABS (pertinent positives/negatives)  Salicylate, acetaminophen, ethanol below threshold CBC wbc 5.5, hgb 12.3, plt 232 CMP na 139, k 3.1, glu 95, cr 0.84  ____________________________________________   EKG  None  ____________________________________________    RADIOLOGY  None   ____________________________________________   PROCEDURES  Procedures  ____________________________________________   INITIAL IMPRESSION / ASSESSMENT AND PLAN / ED COURSE  Pertinent labs & imaging results that were available during my care of the patient were reviewed by me and considered in my medical decision making (see chart for details).   Patient presented to the emergency department today with concerns for depression and thoughts of wanting to use drugs.  This time patient does appear depressed.  Will plan on having psychiatry evaluate.  The patient has been placed in  psychiatric observation due to the need to provide a safe environment for the patient while obtaining psychiatric consultation and evaluation, as well as ongoing medical and medication management to treat the patient's condition.  The patient has not been placed under full IVC at this time.  ____________________________________________   FINAL CLINICAL IMPRESSION(S) / ED DIAGNOSES  Depression  Note: This dictation was prepared with Dragon dictation. Any transcriptional errors that result from this process are unintentional     Phineas Semen, MD 09/22/20 2316

## 2020-09-22 NOTE — ED Notes (Signed)
EDP at bedside  

## 2020-09-23 DIAGNOSIS — F4310 Post-traumatic stress disorder, unspecified: Secondary | ICD-10-CM

## 2020-09-23 DIAGNOSIS — F32A Depression, unspecified: Secondary | ICD-10-CM

## 2020-09-23 DIAGNOSIS — F3160 Bipolar disorder, current episode mixed, unspecified: Secondary | ICD-10-CM

## 2020-09-23 DIAGNOSIS — F1120 Opioid dependence, uncomplicated: Secondary | ICD-10-CM

## 2020-09-23 DIAGNOSIS — F419 Anxiety disorder, unspecified: Secondary | ICD-10-CM

## 2020-09-23 LAB — POC URINE PREG, ED: Preg Test, Ur: POSITIVE — AB

## 2020-09-23 LAB — URINALYSIS, COMPLETE (UACMP) WITH MICROSCOPIC
Bilirubin Urine: NEGATIVE
Glucose, UA: NEGATIVE mg/dL
Ketones, ur: NEGATIVE mg/dL
Leukocytes,Ua: NEGATIVE
Nitrite: NEGATIVE
Protein, ur: NEGATIVE mg/dL
RBC / HPF: >50 RBC/hpf — ABNORMAL HIGH (ref 0–5)
Specific Gravity, Urine: 1.018 (ref 1.005–1.030)
pH: 6 (ref 5.0–8.0)

## 2020-09-23 LAB — SARS CORONAVIRUS 2 BY RT PCR (HOSPITAL ORDER, PERFORMED IN ~~LOC~~ HOSPITAL LAB): SARS Coronavirus 2: POSITIVE — AB

## 2020-09-23 MED ORDER — TRAZODONE HCL 100 MG PO TABS: ORAL_TABLET | Freq: Every evening | ORAL | 0 refills | Status: AC | PRN

## 2020-09-23 MED ORDER — QUETIAPINE FUMARATE 400 MG PO TABS
400.0000 mg | ORAL_TABLET | Freq: Every day | ORAL | 0 refills | Status: AC
Start: 2020-09-23 — End: 2020-10-23

## 2020-09-23 MED ORDER — ACETAMINOPHEN 500 MG PO TABS
1000.0000 mg | ORAL_TABLET | Freq: Four times a day (QID) | ORAL | Status: DC | PRN
  Administered 2020-09-23: 1000 mg via ORAL
  Filled 2020-09-23: qty 2

## 2020-09-23 MED ORDER — ONDANSETRON 4 MG PO TBDP
4.0000 mg | ORAL_TABLET | Freq: Three times a day (TID) | ORAL | Status: DC | PRN
  Administered 2020-09-23: 4 mg via ORAL
  Filled 2020-09-23: qty 1

## 2020-09-23 MED ORDER — BUSPIRONE HCL 15 MG PO TABS
15.0000 mg | ORAL_TABLET | Freq: Three times a day (TID) | ORAL | 0 refills | Status: AC
Start: 2020-09-23 — End: 2020-10-23

## 2020-09-23 MED ORDER — GABAPENTIN 400 MG PO CAPS: 400.0000 mg | ORAL_CAPSULE | Freq: Three times a day (TID) | ORAL | 0 refills | Status: AC

## 2020-09-23 MED ORDER — FLUOXETINE HCL 40 MG PO CAPS
40.0000 mg | ORAL_CAPSULE | Freq: Every day | ORAL | 0 refills | Status: AC
Start: 2020-09-23 — End: 2020-10-23

## 2020-09-23 NOTE — ED Notes (Signed)
This RN contacted group home leader and discussed discharge instructions with patient consent. All discharge paper work and prescriptions given to patient. No further questions at this time. Cab voucher provided for patient. Pt in NAD at time of discharge.

## 2020-09-23 NOTE — ED Notes (Signed)
Pt requesting RN to call Osvaldo Human, her sponsor and update that pt would be staying the night in the ED. [phone] no answer, left HIPPA compliant message.

## 2020-09-23 NOTE — ED Notes (Signed)
Report to annie, rn.  

## 2020-09-23 NOTE — Consult Note (Signed)
Brief consult.  Full note to follow.  Patient COVID-positive.  Does not meet commitment criteria.  Does not need inpatient treatment.  Agreed to restart psychiatric medicine she was taking on as recently as a couple months ago and continue with referral to [facility].

## 2020-09-23 NOTE — ED Provider Notes (Signed)
Emergency Medicine Observation Re-evaluation Note  Dominique Bennett is a 35 y.o. female, seen on rounds today.  Pt initially presented to the ED for complaints of Psychiatric Evaluation Currently, the patient is complaining of headache and abdominal discomfort.  She is COVID-positive and had a D&C 09/02/2020 due to a missed abortion.  Physical Exam  BP 115/89 (BP Location: Left Arm)   Pulse 82   Temp 98.7 F (37.1 C) (Oral)   Resp 18   Ht 5\' 6"  (1.676 m)   Wt 83.9 kg   LMP 06/22/2020   SpO2 97%   BMI 29.86 kg/m  Physical Exam Gen: No acute distress  Resp: Normal rise and fall of chest Neuro: Moving all four extremities Psych: Agitated but redirectable.    ED Course / MDM  EKG:   I have reviewed the labs performed to date as well as medications administered while in observation.  Recent changes in the last 24 hours include no acute events overnight.  Plan  Current plan is for psychiatric evaluation for further disposition.  Patient complaining of abdominal pain.  Initial screening labs unremarkable.  Pregnancy test still positive but had a D&C 21 days ago for missed abortion.  She is COVID-positive here.  Will give Tylenol, Zofran for symptomatic relief.  Abdominal exam is benign.  She did recently have pyelonephritis.  Will obtain urinalysis and culture.  She denies any significant abdominal bleeding or discharge.  She does report she is having diarrhea.  Symptoms may be secondary to COVID-19 diagnosis.  We will continue to monitor.   Patient is not under full IVC at this time.   Coleson Kant, 06/24/2020, DO 09/23/20 [phone]

## 2020-09-23 NOTE — ED Notes (Signed)
Pt found to be asleep in bed.

## 2020-09-23 NOTE — ED Notes (Signed)
Requesting for patient to give new urine sample. Provided cup and wipes and instructed on use.

## 2020-09-23 NOTE — ED Provider Notes (Signed)
Patient resting comfortably at this time.  Understanding of the plan to be discharged.  Patient had 1 positive pregnancy test and one negative pregnancy test, however it appears that the patient had a dilation and evacuation done at the beginning of this month.  Seen by Dr. Toni Amend who is recommending discharge, has submitted prescriptions for patient for treatment of major depression  The patient is currently resting now comfortable without pain or discomfort.  She does report that for a few weeks now she has been experiencing nasal congestion, had a mild fever and some nausea as well over the last couple of days and a mild cough.  Her COVID test is positive, and I suspect she has had symptoms for more than a few days, but not certain.  Placed referral to our COVID referral team to further get investigate treatment options, but at this point given she has had nasal symptoms for several days, I suspect she is likely outside of the typical paxlovid window.  She appears well nontoxic.  Fully oriented.  Plans to call family to have her come pick her up.  Discussed careful return precautions around both COVID as well as her depression and patient understand agreeable with return precautions  Return precautions and treatment recommendations and follow-up discussed with the patient who is agreeable with the plan.      Sharyn Creamer, MD 09/23/20 [phone]
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
## 2020-09-23 NOTE — Consult Note (Signed)
[facility]   Reason for Consult:  Psych Evalua Referring Physician:  EDP Patient Identification: Dominique Bennett Born MRN:  [id number] Principal Diagnosis: <principal problem not specified> Diagnosis:  Active Problems:   PTSD (post-traumatic stress disorder)   Opioid use disorder, moderate, dependence (HCC)   Bipolar disorder (HCC)   Anxiety and depression   Total Time spent with patient: 1 hour  Subjective:   Dominique Bennett Knepp is a 35 y.o. female patient admitted with complaints of si and selfharm. Per triage nurse, pt brought in from recovery house stating that she just want to get high or harm herself. "Iwant to be numb, I dont want to feel this pain anymore." She got anxiety and called her sponsor and they had her come here. Pt tearful in triage. HPI:   History of Present Illness   Patient Identification Dominique Bennett is a 35 y.o. female.  Patient information was obtained from patient. History/Exam limitations: none. Patient presented voluntarily to the Emergency Department by private vehicle.  Chief Complaint  Psychiatric Evaluation   Patient presents for psychiatric evaluation and requires medical clearance exam. Patient is brought by halfway house staff. She is placed on Mental Health Hold and is not requiring physical restraint. Patient requires psychiatric evaluation with concern for suicidal ideation, anxiety, depression and substance abuse. She has associated symptoms including stomachache which began about a few days ago. Patient was not referred by a therapist or psychiatrist. Patient has a history of depression, PTSD and subsatnce abuse, for which she has been hospitalized a few times. The last physical exam was within the past year. Patient has 4 children and does not have custody of any. She isay it is 8 years today since she has seen her daughter and she had a d&c on may 5th for a recent pregnancy. She these are contributing to her current depression.  She also states that she has run out of her recent medication, but she can't recall the names of them.  Her UDS was negative with the exception of tricyclics.   Patient complains of depression and suicidal thoughts/threats. Onset of symptoms was gradual starting a few week ago. Symptoms are of severe severity and are unchanged. Patient states symptoms have been exacerbated by financial burdens, chemical dependency and lack of support. Symptoms are associated with intent to harm self threat of jumping from height, previous suicide attempts and past psychiatric history: suicidal ideation. History obtained from: patient and Patient expressed intent to harm self without definite plan.. Care prior to arrival consisted of nothing, with no relief.   Past Medical History:  Diagnosis Date  . Anxiety   . Asthma   . Bipolar 1 disorder (HCC)   . Carpal tunnel syndrome   . Chronic back pain   . Depression   . IBS (irritable bowel syndrome)   . Migraines   . PTSD (post-traumatic stress disorder)   . Sleep disorder   . Substance use disorder    No family history on file. Current Facility-Administered Medications  Medication Dose Route Frequency Provider Last Rate Last Admin  . acetaminophen (TYLENOL) tablet 1,000 mg  1,000 mg Oral Q6H PRN Ward, Kristen N, DO   1,000 mg at 09/23/20 0317  . ondansetron (ZOFRAN-ODT) disintegrating tablet 4 mg  4 mg Oral Q8H PRN Ward, Kristen N, DO   4 mg at 09/23/20 [id number]   Current Outpatient Medications  Medication Sig Dispense Refill  . acetaminophen (TYLENOL) 325 MG tablet Take 2 tablets (650 mg total) by mouth every  6 (six) hours as needed for mild pain (or Fever >/= 101).    . busPIRone (BUSPAR) 15 MG tablet TAKE ONE TABLET BY MOUTH 3 TIMES A DAY 90 tablet 0  . docusate sodium (COLACE) 100 MG capsule Take 1 capsule (100 mg total) by mouth 2 (two) times daily. 60 capsule 0  . FLUoxetine (PROZAC) 40 MG capsule TAKE ONE CAPSULE BY MOUTH EVERY DAY 30 capsule 0  .  fluticasone (FLONASE) 50 MCG/ACT nasal spray Spray 2 sprays into both nostrils once daily. 16 g 0  . gabapentin (NEURONTIN) 400 MG capsule Take 1 capsule (400 mg total) by mouth 3 (three) times daily. 90 capsule 1  . loratadine (CLARITIN) 10 MG tablet Take 1 tablet (10 mg total) by mouth daily.    . nicotine (NICODERM CQ - DOSED IN MG/24 HOURS) 21 mg/24hr patch Place 1 patch (21 mg total) onto the skin daily. 28 patch 0  . ondansetron (ZOFRAN) 4 MG tablet Take 1 tablet (4 mg total) by mouth every 6 (six) hours as needed for nausea. 20 tablet 0  . oxyCODONE-acetaminophen (PERCOCET/ROXICET) 5-325 MG tablet Take 1-2 tablets by mouth every 4 (four) hours as needed for moderate pain or severe pain. 15 tablet 0  . polyethylene glycol (MIRALAX / GLYCOLAX) 17 g packet Mix with 8oz of water and take 17 g (1packet) by mouth daily as needed for moderate constipation. 14 each 0  . Prenatal Vit-Fe Fumarate-FA (MULTIVITAMIN-PRENATAL) 27-0.8 MG TABS tablet Take 1 tablet by mouth daily at 12 noon. 100 tablet 0  . QUEtiapine (SEROQUEL) 100 MG tablet TAKE 1 AND 1/2 TABLETS (150 MG) BY MOUTH EVERY MORNING AND TAKE 4.5 TABLETS (450 MG) BY MOUTHAT BEDTIME 180 tablet 0  . traZODone (DESYREL) 100 MG tablet TAKE ONE TABLET BY MOUTH AT BEDTIME AS NEEDED FOR SLEEP (Patient taking differently: Take 100 mg by mouth at bedtime as needed. for sleep) 30 tablet 0   Allergies  Allergen Reactions  . Duloxetine Other (See Comments)    Suicidal Patient states it causes her to think about suicide   . Egg Shells Anaphylaxis  . Ibuprofen Other (See Comments)    "kidneys start to shut down"  . Tramadol Other (See Comments)    Paralysis of bladder  . Fluoxetine Rash  . Cymbalta [Duloxetine Hcl] Other (See Comments)    Altered mental status  . Other Hives    Flu vaccine  . Latex Rash  . Nickel Itching and Rash   Social History   Socioeconomic History  . Marital status: Legally Separated    Spouse name: Not on file  .  Number of children: Not on file  . Years of education: Not on file  . Highest education level: Not on file  Occupational History  . Not on file  Tobacco Use  . Smoking status: Current Every Day Smoker    Packs/day: 0.50    Types: Cigarettes  . Smokeless tobacco: Never Used  Vaping Use  . Vaping Use: Some days  . Substances: Nicotine  Substance and Sexual Activity  . Alcohol use: Not Currently  . Drug use: Not Currently    Types: "Crack" cocaine    Comment: 05/04/2020- last use-alcohol, crack, hx heroin  . Sexual activity: Yes    Comment: hx IUD, depo, implant, ocp  Other Topics Concern  . Not on file  Social History Narrative  . Not on file   Social Determinants of Health   Financial Resource Strain: Not on file  Food  Insecurity: Not on file  Transportation Needs: Not on file  Physical Activity: Not on file  Stress: Not on file  Social Connections: Not on file  Intimate Partner Violence: Not At Risk  . Fear of Current or Ex-Partner: No  . Emotionally Abused: No  . Physically Abused: No  . Sexually Abused: No   Past Psychiatric History: Unknown  Risk to Self:   Risk to Others:   Prior Inpatient Therapy:   Prior Outpatient Therapy:    Past Medical History:  Past Medical History:  Diagnosis Date  . Anxiety   . Asthma   . Bipolar 1 disorder (HCC)   . Carpal tunnel syndrome   . Chronic back pain   . Depression   . IBS (irritable bowel syndrome)   . Migraines   . PTSD (post-traumatic stress disorder)   . Sleep disorder   . Substance use disorder     Past Surgical History:  Procedure Laterality Date  . CESAREAN SECTION    . DILATION AND EVACUATION N/A 09/02/2020   Procedure: DILATATION AND EVACUATION;  Surgeon: Hildred Laser, MD;  Location: [facility] ORS;  Service: Gynecology;  Laterality: N/A;  . LITHOTRIPSY    . TONSILLECTOMY     Family History: No family history on file. Family Psychiatric  History: unknown Social History:  Social History   Substance and  Sexual Activity  Alcohol Use Not Currently     Social History   Substance and Sexual Activity  Drug Use Not Currently  . Types: "Crack" cocaine   Comment: 05/04/2020- last use-alcohol, crack, hx heroin    Social History   Socioeconomic History  . Marital status: Legally Separated    Spouse name: Not on file  . Number of children: Not on file  . Years of education: Not on file  . Highest education level: Not on file  Occupational History  . Not on file  Tobacco Use  . Smoking status: Current Every Day Smoker    Packs/day: 0.50    Types: Cigarettes  . Smokeless tobacco: Never Used  Vaping Use  . Vaping Use: Some days  . Substances: Nicotine  Substance and Sexual Activity  . Alcohol use: Not Currently  . Drug use: Not Currently    Types: "Crack" cocaine    Comment: 05/04/2020- last use-alcohol, crack, hx heroin  . Sexual activity: Yes    Comment: hx IUD, depo, implant, ocp  Other Topics Concern  . Not on file  Social History Narrative  . Not on file   Social Determinants of Health   Financial Resource Strain: Not on file  Food Insecurity: Not on file  Transportation Needs: Not on file  Physical Activity: Not on file  Stress: Not on file  Social Connections: Not on file   Additional Social History:    Allergies:   Allergies  Allergen Reactions  . Duloxetine Other (See Comments)    Suicidal Patient states it causes her to think about suicide   . Egg Shells Anaphylaxis  . Ibuprofen Other (See Comments)    "kidneys start to shut down"  . Tramadol Other (See Comments)    Paralysis of bladder  . Fluoxetine Rash  . Cymbalta [Duloxetine Hcl] Other (See Comments)    Altered mental status  . Other Hives    Flu vaccine  . Latex Rash  . Nickel Itching and Rash    Labs:  Results for orders placed or performed during the hospital encounter of 09/22/20 (from the past 48 hour(s))  Comprehensive metabolic panel     Status: Abnormal   Collection Time: 09/22/20  9:24  PM  Result Value Ref Range   Sodium 139 135 - 145 mmol/L   Potassium 3.1 (L) 3.5 - 5.1 mmol/L   Chloride 110 98 - 111 mmol/L   CO2 21 (L) 22 - 32 mmol/L   Glucose, Bld 95 70 - 99 mg/dL    Comment: Glucose reference range applies only to samples taken after fasting for at least 8 hours.   BUN <5 (L) 6 - 20 mg/dL   Creatinine, Ser 1.61 0.44 - 1.00 mg/dL   Calcium 8.4 (L) 8.9 - 10.3 mg/dL   Total Protein 6.5 6.5 - 8.1 g/dL   Albumin 3.5 3.5 - 5.0 g/dL   AST 25 15 - 41 U/L   ALT 19 0 - 44 U/L   Alkaline Phosphatase 59 38 - 126 U/L   Total Bilirubin 0.5 0.3 - 1.2 mg/dL   GFR, Estimated >09 >60 mL/min    Comment: (NOTE) Calculated using the CKD-EPI Creatinine Equation (2021)    Anion gap 8 5 - 15    Comment: Performed at [facility], [address] [place], [place] [address]  Ethanol     Status: None   Collection Time: 09/22/20  9:24 PM  Result Value Ref Range   Alcohol, Ethyl (B) <10 <10 mg/dL    Comment: (NOTE) Lowest detectable limit for serum alcohol is 10 mg/dL.  For medical purposes only. Performed at [facility], [address] [place], [place] [address]   Salicylate level     Status: Abnormal   Collection Time: 09/22/20  9:24 PM  Result Value Ref Range   Salicylate Lvl <7.0 (L) 7.0 - 30.0 mg/dL    Comment: Performed at [facility], [address] [place], [place] [address]  Acetaminophen level     Status: Abnormal   Collection Time: 09/22/20  9:24 PM  Result Value Ref Range   Acetaminophen (Tylenol), Serum <10 (L) 10 - 30 ug/mL    Comment: (NOTE) Therapeutic concentrations vary significantly. A range of 10-30 ug/mL  may be an effective concentration for many patients. However, some  are best treated at concentrations outside of this range. Acetaminophen concentrations >150 ug/mL at 4 hours after ingestion  and >50 ug/mL at 12 hours after ingestion are often associated with  toxic reactions.  Performed at [facility],  [address] [place], [place] [address]   cbc     Status: Abnormal   Collection Time: 09/22/20  9:24 PM  Result Value Ref Range   WBC 5.5 4.0 - 10.5 K/uL   RBC 3.62 (L) 3.87 - 5.11 MIL/uL   Hemoglobin 12.3 12.0 - 15.0 g/dL   HCT 08.6 (L) 57.8 - 46.9 %   MCV 97.5 80.0 - 100.0 fL   MCH 34.0 26.0 - 34.0 pg   MCHC 34.8 30.0 - 36.0 g/dL   RDW 62.9 52.8 - 41.3 %   Platelets 232 150 - 400 K/uL   nRBC 0.0 0.0 - 0.2 %    Comment: Performed at [facility], [address] [place], [place] [address]  Urine Drug Screen, Qualitative     Status: Abnormal   Collection Time: 09/22/20  9:24 PM  Result Value Ref Range   Tricyclic, Ur Screen POSITIVE (A) NONE DETECTED   Amphetamines, Ur Screen NONE DETECTED NONE DETECTED   MDMA (Ecstasy)Ur Screen NONE DETECTED NONE DETECTED   Cocaine Metabolite,Ur [place] NONE  DETECTED NONE DETECTED   Opiate, Ur Screen NONE DETECTED NONE DETECTED   Phencyclidine (PCP) Ur S NONE DETECTED NONE DETECTED   Cannabinoid 50 Ng, Ur [place] NONE DETECTED NONE DETECTED   Barbiturates, Ur Screen NONE DETECTED NONE DETECTED   Benzodiazepine, Ur Scrn NONE DETECTED NONE DETECTED   Methadone Scn, Ur NONE DETECTED NONE DETECTED    Comment: (NOTE) Tricyclics + metabolites, urine    Cutoff 1000 ng/mL Amphetamines + metabolites, urine  Cutoff 1000 ng/mL MDMA (Ecstasy), urine              Cutoff 500 ng/mL Cocaine Metabolite, urine          Cutoff 300 ng/mL Opiate + metabolites, urine        Cutoff 300 ng/mL Phencyclidine (PCP), urine         Cutoff 25 ng/mL Cannabinoid, urine                 Cutoff 50 ng/mL Barbiturates + metabolites, urine  Cutoff 200 ng/mL Benzodiazepine, urine              Cutoff 200 ng/mL Methadone, urine                   Cutoff 300 ng/mL  The urine drug screen provides only a preliminary, unconfirmed analytical test result and should not be used for non-medical purposes. Clinical consideration and professional judgment should be applied to any  positive drug screen result due to possible interfering substances. A more specific alternate chemical method must be used in order to obtain a confirmed analytical result. Gas chromatography / mass spectrometry (GC/MS) is the preferred confirm atory method. Performed at [facility], [address] [place], [place] [address]   POC urine preg, ED     Status: None   Collection Time: 09/22/20 10:14 PM  Result Value Ref Range   Preg Test, Ur NEGATIVE NEGATIVE    Comment:        THE SENSITIVITY OF THIS METHODOLOGY IS >24 mIU/mL   POC urine preg, ED (not at [facility])     Status: Abnormal   Collection Time: 09/22/20 10:39 PM  Result Value Ref Range   Preg Test, Ur POSITIVE (A) NEGATIVE    Comment:        THE SENSITIVITY OF THIS METHODOLOGY IS >24 mIU/mL   SARS Coronavirus 2 by RT PCR (hospital order, performed in [facility] hospital lab)     Status: Abnormal   Collection Time: 09/22/20 11:21 PM  Result Value Ref Range   SARS Coronavirus 2 POSITIVE (A) NEGATIVE    Comment: RESULT CALLED TO, READ BACK BY AND VERIFIED WITH: ALLIE YOW @ 0024 09/23/20 LFD (NOTE) SARS-CoV-2 target nucleic acids are DETECTED  SARS-CoV-2 RNA is generally detectable in upper respiratory specimens  during the acute phase of infection.  Positive results are indicative  of the presence of the identified virus, but do not rule out bacterial infection or co-infection with other pathogens not detected by the test.  Clinical correlation with patient history and  other diagnostic information is necessary to determine patient infection status.  The expected result is negative.  Fact Sheet for Patients:   BoilerBrush.com.cy   Fact Sheet for Healthcare Providers:   https://pope.com/    This test is not yet approved or cleared by the [place] FDA and  has been authorized for detection and/or diagnosis of SARS-CoV-2 by FDA under an Emergency Use  Authorization (EUA).  This EUA will remain in  effect (meaning this test ca n be used) for the duration of  the COVID-19 declaration under Section 564(b)(1) of the Act, 21 U.S.C. section 360-bbb-3(b)(1), unless the authorization is terminated or revoked sooner.  Performed at [facility], [address], [place], [place] [address]     Current Facility-Administered Medications  Medication Dose Route Frequency Provider Last Rate Last Admin  . acetaminophen (TYLENOL) tablet 1,000 mg  1,000 mg Oral Q6H PRN Ward, Kristen N, DO   1,000 mg at 09/23/20 0317  . ondansetron (ZOFRAN-ODT) disintegrating tablet 4 mg  4 mg Oral Q8H PRN Ward, Kristen N, DO   4 mg at 09/23/20 8366   Current Outpatient Medications  Medication Sig Dispense Refill  . acetaminophen (TYLENOL) 325 MG tablet Take 2 tablets (650 mg total) by mouth every 6 (six) hours as needed for mild pain (or Fever >/= 101).    . busPIRone (BUSPAR) 15 MG tablet TAKE ONE TABLET BY MOUTH 3 TIMES A DAY 90 tablet 0  . docusate sodium (COLACE) 100 MG capsule Take 1 capsule (100 mg total) by mouth 2 (two) times daily. 60 capsule 0  . FLUoxetine (PROZAC) 40 MG capsule TAKE ONE CAPSULE BY MOUTH EVERY DAY 30 capsule 0  . fluticasone (FLONASE) 50 MCG/ACT nasal spray Spray 2 sprays into both nostrils once daily. 16 g 0  . gabapentin (NEURONTIN) 400 MG capsule Take 1 capsule (400 mg total) by mouth 3 (three) times daily. 90 capsule 1  . loratadine (CLARITIN) 10 MG tablet Take 1 tablet (10 mg total) by mouth daily.    . nicotine (NICODERM CQ - DOSED IN MG/24 HOURS) 21 mg/24hr patch Place 1 patch (21 mg total) onto the skin daily. 28 patch 0  . ondansetron (ZOFRAN) 4 MG tablet Take 1 tablet (4 mg total) by mouth every 6 (six) hours as needed for nausea. 20 tablet 0  . oxyCODONE-acetaminophen (PERCOCET/ROXICET) 5-325 MG tablet Take 1-2 tablets by mouth every 4 (four) hours as needed for moderate pain or severe pain. 15 tablet 0  . polyethylene  glycol (MIRALAX / GLYCOLAX) 17 g packet Mix with 8oz of water and take 17 g (1packet) by mouth daily as needed for moderate constipation. 14 each 0  . Prenatal Vit-Fe Fumarate-FA (MULTIVITAMIN-PRENATAL) 27-0.8 MG TABS tablet Take 1 tablet by mouth daily at 12 noon. 100 tablet 0  . QUEtiapine (SEROQUEL) 100 MG tablet TAKE 1 AND 1/2 TABLETS (150 MG) BY MOUTH EVERY MORNING AND TAKE 4.5 TABLETS (450 MG) BY MOUTHAT BEDTIME 180 tablet 0  . traZODone (DESYREL) 100 MG tablet TAKE ONE TABLET BY MOUTH AT BEDTIME AS NEEDED FOR SLEEP (Patient taking differently: Take 100 mg by mouth at bedtime as needed. for sleep) 30 tablet 0    Musculoskeletal: Strength & Muscle Tone: within normal limits Gait & Station: normal Patient leans: N/A  Psychiatric Specialty Exam:  Presentation  General Appearance: Appropriate for Environment  Eye Contact:Fair  Speech:Clear and Coherent  Speech Volume:Normal  Handedness:Right   Mood and Affect  Mood:Depressed  Affect:Appropriate   Thought Process  Thought Processes:Coherent  Descriptions of Associations:Intact  Orientation:Full (Time, Place and Person)  Thought Content:WDL  History of Schizophrenia/Schizoaffective disorder:No data recorded Duration of Psychotic Symptoms:No data recorded Hallucinations:Hallucinations: None  Ideas of Reference:None  Suicidal Thoughts:Suicidal Thoughts: Yes, Active SI Active Intent and/or Plan: Without Intent; With Means to Carry Out  Homicidal Thoughts:Homicidal Thoughts: No   Sensorium  Memory:Immediate Good  Judgment:Impaired  Insight:Lacking   Executive Functions  Concentration:Fair  Attention  Span:Fair  Recall:Fair  Fund of Knowledge:Fair  Language:Fair   Psychomotor Activity  Psychomotor Activity:Psychomotor Activity: Normal   Assets  Assets:Desire for Improvement; Housing; Vocational/Educational   Sleep  Sleep:Sleep: Poor   Physical Exam: Physical Exam Vitals and nursing note  reviewed.    Review of Systems  Psychiatric/Behavioral: Positive for depression and suicidal ideas.  All other systems reviewed and are negative.  Blood pressure 115/89, pulse 82, temperature 98.7 F (37.1 C), temperature source Oral, resp. rate 18, height  (1.676 m), weight 83.9 kg, last menstrual period 06/22/2020, SpO2 97 %. Body mass index is 29.86 kg/m.  Treatment Plan Summary: Daily contact with patient to assess and evaluate symptoms and progress in treatment, Medication management and Plan inpatient psychiatry  Disposition: Recommend psychiatric Inpatient admission when medically cleared. Supportive therapy provided about ongoing stressors. Discussed crisis plan, support from social network, calling 911, coming to the Emergency Department, and calling Suicide Hotline.  Jearld Lesch, NP 09/23/2020 4:02 AM

## 2020-09-23 NOTE — Discharge Instructions (Addendum)

## 2020-09-23 NOTE — ED Notes (Signed)
E-signature not working at this time. Pt verbalized understanding of D/C instructions, prescriptions and follow up care with no further questions at this time. Pt in NAD and ambulatory at time of D/C.  Belongings bag 1/1 returned to patient at time of discharge.  

## 2020-09-23 NOTE — Consult Note (Signed)
Consult done.  Full note to follow.  Reevaluate.  Patient does not have active suicidal intention or plan.  She is COVID positive and not eligible to be referred to inpatient treatment at this time.  Agrees that she would be safe with a plan to have her medicine prescribed and be discharged back to [place] [facility] and regular outpatient treatment including appointment at [facility].

## 2020-09-23 NOTE — ED Notes (Signed)
Dr. Elesa Massed notified in person of patient positive COVID test.

## 2020-09-23 NOTE — Consult Note (Signed)
[facility] Face-to-Face Psychiatry Consult   Reason for Consult:   Consult to follow-up with this 35 year old woman with depression and substance abuse Referring Physician: Quale Patient Identification: Dominique Bennett MRN:  [id number] Principal Diagnosis: Anxiety and depression Diagnosis:  Principal Problem:   Anxiety and depression Active Problems:   PTSD (post-traumatic stress disorder)   Opioid use disorder, moderate, dependence (HCC)   Bipolar disorder (HCC)   Total Time spent with patient: 45 minutes  Subjective:   Dominique Bennett is a 35 y.o. female patient admitted with "I was afraid I would relapse".  HPI: Patient seen chart reviewed.  Patient came to the emergency room saying she was afraid she was going to relapse.  She has been in an [facility] but her anxiety and depression have been getting worse.  She has been off of her prescribed medicine for a while.  She had thoughts about hurting herself but no specific plan.  No active hallucinations.  Patient is supposed to be on Seroquel trazodone gabapentin and BuSpar but has not been taking any except the trazodone.  Since being in the emergency room she was found to have a positive COVID test  Past Psychiatric History: Past history of substance abuse depression and possible bipolar disorder with mood swings.  Currently maintaining sobriety in an [place] house.  Past history of suicidal ideation with cutting  Risk to Self:   Risk to Others:   Prior Inpatient Therapy:   Prior Outpatient Therapy:    Past Medical History:  Past Medical History:  Diagnosis Date  . Anxiety   . Asthma   . Bipolar 1 disorder (HCC)   . Carpal tunnel syndrome   . Chronic back pain   . Depression   . IBS (irritable bowel syndrome)   . Migraines   . PTSD (post-traumatic stress disorder)   . Sleep disorder   . Substance use disorder     Past Surgical History:  Procedure Laterality Date  . CESAREAN SECTION    . DILATION AND EVACUATION N/A 09/02/2020    Procedure: DILATATION AND EVACUATION;  Surgeon: Hildred Laser, MD;  Location: [facility] ORS;  Service: Gynecology;  Laterality: N/A;  . LITHOTRIPSY    . TONSILLECTOMY     Family History: No family history on file. Family Psychiatric  History: None reported Social History:  Social History   Substance and Sexual Activity  Alcohol Use Not Currently     Social History   Substance and Sexual Activity  Drug Use Not Currently  . Types: "Crack" cocaine   Comment: 05/04/2020- last use-alcohol, crack, hx heroin    Social History   Socioeconomic History  . Marital status: Legally Separated    Spouse name: Not on file  . Number of children: Not on file  . Years of education: Not on file  . Highest education level: Not on file  Occupational History  . Not on file  Tobacco Use  . Smoking status: Current Every Day Smoker    Packs/day: 0.50    Types: Cigarettes  . Smokeless tobacco: Never Used  Vaping Use  . Vaping Use: Some days  . Substances: Nicotine  Substance and Sexual Activity  . Alcohol use: Not Currently  . Drug use: Not Currently    Types: "Crack" cocaine    Comment: 05/04/2020- last use-alcohol, crack, hx heroin  . Sexual activity: Yes    Comment: hx IUD, depo, implant, ocp  Other Topics Concern  . Not on file  Social History Narrative  . Not  on file   Social Determinants of Health   Financial Resource Strain: Not on file  Food Insecurity: Not on file  Transportation Needs: Not on file  Physical Activity: Not on file  Stress: Not on file  Social Connections: Not on file   Additional Social History:    Allergies:   Allergies  Allergen Reactions  . Duloxetine Other (See Comments)    Suicidal Patient states it causes her to think about suicide   . Egg Shells Anaphylaxis  . Ibuprofen Other (See Comments)    "kidneys start to shut down"  . Tramadol Other (See Comments)    Paralysis of bladder  . Fluoxetine Rash  . Cymbalta [Duloxetine Hcl] Other (See Comments)     Altered mental status  . Other Hives    Flu vaccine  . Latex Rash  . Nickel Itching and Rash    Labs:  Results for orders placed or performed during the hospital encounter of 09/22/20 (from the past 48 hour(s))  Comprehensive metabolic panel     Status: Abnormal   Collection Time: 09/22/20  9:24 PM  Result Value Ref Range   Sodium 139 135 - 145 mmol/L   Potassium 3.1 (L) 3.5 - 5.1 mmol/L   Chloride 110 98 - 111 mmol/L   CO2 21 (L) 22 - 32 mmol/L   Glucose, Bld 95 70 - 99 mg/dL    Comment: Glucose reference range applies only to samples taken after fasting for at least 8 hours.   BUN <5 (L) 6 - 20 mg/dL   Creatinine, Ser 1.61 0.44 - 1.00 mg/dL   Calcium 8.4 (L) 8.9 - 10.3 mg/dL   Total Protein 6.5 6.5 - 8.1 g/dL   Albumin 3.5 3.5 - 5.0 g/dL   AST 25 15 - 41 U/L   ALT 19 0 - 44 U/L   Alkaline Phosphatase 59 38 - 126 U/L   Total Bilirubin 0.5 0.3 - 1.2 mg/dL   GFR, Estimated >09 >60 mL/min    Comment: (NOTE) Calculated using the CKD-EPI Creatinine Equation (2021)    Anion gap 8 5 - 15    Comment: Performed at [facility], [address] [place], [place] [address]  Ethanol     Status: None   Collection Time: 09/22/20  9:24 PM  Result Value Ref Range   Alcohol, Ethyl (B) <10 <10 mg/dL    Comment: (NOTE) Lowest detectable limit for serum alcohol is 10 mg/dL.  For medical purposes only. Performed at [facility], [address] [place], [place] [address]   Salicylate level     Status: Abnormal   Collection Time: 09/22/20  9:24 PM  Result Value Ref Range   Salicylate Lvl <7.0 (L) 7.0 - 30.0 mg/dL    Comment: Performed at [facility], [address] [place], [place] [address]  Acetaminophen level     Status: Abnormal   Collection Time: 09/22/20  9:24 PM  Result Value Ref Range   Acetaminophen (Tylenol), Serum <10 (L) 10 - 30 ug/mL    Comment: (NOTE) Therapeutic concentrations vary significantly. A range of 10-30 ug/mL  may be an  effective concentration for many patients. However, some  are best treated at concentrations outside of this range. Acetaminophen concentrations >150 ug/mL at 4 hours after ingestion  and >50 ug/mL at 12 hours after ingestion are often associated with  toxic reactions.  Performed at [facility], [address], [place], [place] [address]   cbc  Status: Abnormal   Collection Time: 09/22/20  9:24 PM  Result Value Ref Range   WBC 5.5 4.0 - 10.5 K/uL   RBC 3.62 (L) 3.87 - 5.11 MIL/uL   Hemoglobin 12.3 12.0 - 15.0 g/dL   HCT 40.935.3 (L) 81.136.0 - 91.446.0 %   MCV 97.5 80.0 - 100.0 fL   MCH 34.0 26.0 - 34.0 pg   MCHC 34.8 30.0 - 36.0 g/dL   RDW 78.212.6 95.611.5 - 21.315.5 %   Platelets 232 150 - 400 K/uL   nRBC 0.0 0.0 - 0.2 %    Comment: Performed at [facility], [address] [place], [place] [address]  Urine Drug Screen, Qualitative     Status: Abnormal   Collection Time: 09/22/20  9:24 PM  Result Value Ref Range   Tricyclic, Ur Screen POSITIVE (A) NONE DETECTED   Amphetamines, Ur Screen NONE DETECTED NONE DETECTED   MDMA (Ecstasy)Ur Screen NONE DETECTED NONE DETECTED   Cocaine Metabolite,Ur [place] NONE DETECTED NONE DETECTED   Opiate, Ur Screen NONE DETECTED NONE DETECTED   Phencyclidine (PCP) Ur S NONE DETECTED NONE DETECTED   Cannabinoid 50 Ng, Ur [place] NONE DETECTED NONE DETECTED   Barbiturates, Ur Screen NONE DETECTED NONE DETECTED   Benzodiazepine, Ur Scrn NONE DETECTED NONE DETECTED   Methadone Scn, Ur NONE DETECTED NONE DETECTED    Comment: (NOTE) Tricyclics + metabolites, urine    Cutoff 1000 ng/mL Amphetamines + metabolites, urine  Cutoff 1000 ng/mL MDMA (Ecstasy), urine              Cutoff 500 ng/mL Cocaine Metabolite, urine          Cutoff 300 ng/mL Opiate + metabolites, urine        Cutoff 300 ng/mL Phencyclidine (PCP), urine         Cutoff 25 ng/mL Cannabinoid, urine                 Cutoff 50 ng/mL Barbiturates + metabolites, urine  Cutoff 200  ng/mL Benzodiazepine, urine              Cutoff 200 ng/mL Methadone, urine                   Cutoff 300 ng/mL  The urine drug screen provides only a preliminary, unconfirmed analytical test result and should not be used for non-medical purposes. Clinical consideration and professional judgment should be applied to any positive drug screen result due to possible interfering substances. A more specific alternate chemical method must be used in order to obtain a confirmed analytical result. Gas chromatography / mass spectrometry (GC/MS) is the preferred confirm atory method. Performed at [facility], [address] [place], [place] [address]   Urinalysis, Complete w Microscopic Urine, Clean Catch     Status: Abnormal   Collection Time: 09/22/20  9:24 PM  Result Value Ref Range   Color, Urine YELLOW (A) YELLOW   APPearance HAZY (A) CLEAR   Specific Gravity, Urine 1.018 1.005 - 1.030   pH 6.0 5.0 - 8.0   Glucose, UA NEGATIVE NEGATIVE mg/dL   Hgb urine dipstick LARGE (A) NEGATIVE   Bilirubin Urine NEGATIVE NEGATIVE   Ketones, ur NEGATIVE NEGATIVE mg/dL   Protein, ur NEGATIVE NEGATIVE mg/dL   Nitrite NEGATIVE NEGATIVE   Leukocytes,Ua NEGATIVE NEGATIVE   RBC / HPF >50 (H) 0 - 5 RBC/hpf   WBC, UA 0-5 0 - 5 WBC/hpf   Bacteria, UA RARE (A) NONE SEEN   Squamous  Epithelial / LPF 0-5 0 - 5   Mucus PRESENT    Ca Oxalate Crys, UA PRESENT     Comment: Performed at [facility], [address] [place], [place] [address]  POC urine preg, ED     Status: Abnormal   Collection Time: 09/22/20 10:14 PM  Result Value Ref Range   Preg Test, Ur POSITIVE (A) NEGATIVE    Comment:        THE SENSITIVITY OF THIS METHODOLOGY IS >24 mIU/mL TEST WILL BE CREDITED Performed at [facility], [address] [place], [place] [address] CORRECTED ON 05/23 AT 1354: PREVIOUSLY REPORTED AS NEGATIVE        THE SENSITIVITY OF THIS METHODOLOGY IS >24 mIU/mL   POC urine preg, ED (not  at [facility])     Status: Abnormal   Collection Time: 09/22/20 10:39 PM  Result Value Ref Range   Preg Test, Ur POSITIVE (A) NEGATIVE    Comment:        THE SENSITIVITY OF THIS METHODOLOGY IS >24 mIU/mL   SARS Coronavirus 2 by RT PCR (hospital order, performed in [facility] hospital lab)     Status: Abnormal   Collection Time: 09/22/20 11:21 PM  Result Value Ref Range   SARS Coronavirus 2 POSITIVE (A) NEGATIVE    Comment: RESULT CALLED TO, READ BACK BY AND VERIFIED WITH: ALLIE YOW @ 0024 09/23/20 LFD (NOTE) SARS-CoV-2 target nucleic acids are DETECTED  SARS-CoV-2 RNA is generally detectable in upper respiratory specimens  during the acute phase of infection.  Positive results are indicative  of the presence of the identified virus, but do not rule out bacterial infection or co-infection with other pathogens not detected by the test.  Clinical correlation with patient history and  other diagnostic information is necessary to determine patient infection status.  The expected result is negative.  Fact Sheet for Patients:   BoilerBrush.com.cy   Fact Sheet for Healthcare Providers:   https://pope.com/    This test is not yet approved or cleared by the [place] FDA and  has been authorized for detection and/or diagnosis of SARS-CoV-2 by FDA under an Emergency Use Authorization (EUA).  This EUA will remain in effect (meaning this test ca n be used) for the duration of  the COVID-19 declaration under Section 564(b)(1) of the Act, 21 U.S.C. section 360-bbb-3(b)(1), unless the authorization is terminated or revoked sooner.  Performed at [facility], [address], [place], [place] [address]     Current Facility-Administered Medications  Medication Dose Route Frequency Provider Last Rate Last Admin  . acetaminophen (TYLENOL) tablet 1,000 mg  1,000 mg Oral Q6H PRN Ward, Kristen N, DO   1,000 mg at 09/23/20 0317  .  ondansetron (ZOFRAN-ODT) disintegrating tablet 4 mg  4 mg Oral Q8H PRN Ward, Kristen N, DO   4 mg at 09/23/20 6967   Current Outpatient Medications  Medication Sig Dispense Refill  . acetaminophen (TYLENOL) 325 MG tablet Take 2 tablets (650 mg total) by mouth every 6 (six) hours as needed for mild pain (or Fever >/= 101).    Marland Kitchen docusate sodium (COLACE) 100 MG capsule Take 1 capsule (100 mg total) by mouth 2 (two) times daily. 60 capsule 0  . fluticasone (FLONASE) 50 MCG/ACT nasal spray Spray 2 sprays into both nostrils once daily. 16 g 0  . loratadine (CLARITIN) 10 MG tablet Take 1 tablet (10 mg total) by mouth daily.    . nicotine (NICODERM CQ - DOSED  IN MG/24 HOURS) 21 mg/24hr patch Place 1 patch (21 mg total) onto the skin daily. 28 patch 0  . ondansetron (ZOFRAN) 4 MG tablet Take 1 tablet (4 mg total) by mouth every 6 (six) hours as needed for nausea. 20 tablet 0  . oxyCODONE-acetaminophen (PERCOCET/ROXICET) 5-325 MG tablet Take 1-2 tablets by mouth every 4 (four) hours as needed for moderate pain or severe pain. 15 tablet 0  . polyethylene glycol (MIRALAX / GLYCOLAX) 17 g packet Mix with 8oz of water and take 17 g (1packet) by mouth daily as needed for moderate constipation. 14 each 0  . Prenatal Vit-Fe Fumarate-FA (MULTIVITAMIN-PRENATAL) 27-0.8 MG TABS tablet Take 1 tablet by mouth daily at 12 noon. 100 tablet 0  . busPIRone (BUSPAR) 15 MG tablet Take 1 tablet (15 mg total) by mouth 3 (three) times daily. 90 tablet 0  . FLUoxetine (PROZAC) 40 MG capsule Take 1 capsule (40 mg total) by mouth daily. 30 capsule 0  . gabapentin (NEURONTIN) 400 MG capsule Take 1 capsule (400 mg total) by mouth 3 (three) times daily. 90 capsule 0  . QUEtiapine (SEROQUEL) 400 MG tablet Take 1 tablet (400 mg total) by mouth at bedtime. 30 tablet 0  . traZODone (DESYREL) 100 MG tablet TAKE ONE TABLET BY MOUTH AT BEDTIME AS NEEDED FOR SLEEP 30 tablet 0    Musculoskeletal: Strength & Muscle Tone: within normal  limits Gait & Station: normal Patient leans: N/A            Psychiatric Specialty Exam:  Presentation  General Appearance: Appropriate for Environment  Eye Contact:Fair  Speech:Clear and Coherent  Speech Volume:Normal  Handedness:Right   Mood and Affect  Mood:Depressed  Affect:Appropriate   Thought Process  Thought Processes:Coherent  Descriptions of Associations:Intact  Orientation:Full (Time, Place and Person)  Thought Content:WDL  History of Schizophrenia/Schizoaffective disorder:No data recorded Duration of Psychotic Symptoms:No data recorded Hallucinations:Hallucinations: None  Ideas of Reference:None  Suicidal Thoughts:Suicidal Thoughts: Yes, Active SI Active Intent and/or Plan: Without Intent; With Means to Carry Out  Homicidal Thoughts:Homicidal Thoughts: No   Sensorium  Memory:Immediate Good  Judgment:Impaired  Insight:Lacking   Executive Functions  Concentration:Fair  Attention Span:Fair  Recall:Fair  Fund of Knowledge:Fair  Language:Fair   Psychomotor Activity  Psychomotor Activity:Psychomotor Activity: Normal   Assets  Assets:Desire for Improvement; Housing; Vocational/Educational   Sleep  Sleep:Sleep: Poor   Physical Exam: Physical Exam Vitals and nursing note reviewed.  Constitutional:      Appearance: Normal appearance.  HENT:     Head: Normocephalic and atraumatic.     Mouth/Throat:     Pharynx: Oropharynx is clear.  Eyes:     Pupils: Pupils are equal, round, and reactive to light.  Cardiovascular:     Rate and Rhythm: Normal rate and regular rhythm.  Pulmonary:     Effort: Pulmonary effort is normal.     Breath sounds: Normal breath sounds.  Abdominal:     General: Abdomen is flat.     Palpations: Abdomen is soft.  Musculoskeletal:        General: Normal range of motion.  Skin:    General: Skin is warm and dry.  Neurological:     General: No focal deficit present.     Mental Status: She is  alert. Mental status is at baseline.  Psychiatric:        Mood and Affect: Mood normal.        Thought Content: Thought content normal.    Review of Systems  Constitutional:  Negative.   HENT: Negative.   Eyes: Negative.   Respiratory: Positive for cough.   Cardiovascular: Negative.   Gastrointestinal: Negative.   Musculoskeletal: Negative.   Skin: Negative.   Neurological: Negative.   Psychiatric/Behavioral: Positive for depression. Negative for hallucinations, substance abuse and suicidal ideas. The patient is nervous/anxious.    Blood pressure (!) 98/56, pulse (!) 57, temperature 98.4 F (36.9 C), temperature source Oral, resp. rate 18, height 5\' 6"  (1.676 m), weight 83.9 kg, last menstrual period 06/22/2020, SpO2 100 %. Body mass index is 29.86 kg/m.  Treatment Plan Summary: Medication management and Plan Patient is COVID-positive making it impossible to get her admitted to any psychiatric or substance abuse facility at this time.  Reviewed with patient and her options.  Patient is not actively intending or planning on harming herself.  I advised her that I would get her prescriptions to restart her Seroquel gabapentin BuSpar and trazodone.  Printed out prescriptions for all of those for her to take at discharge.  She has a follow-up appointment already arranged at [facility] in another 3 days which she is encouraged to keep or possibly reschedule so that she can complete a whole quarantine.  Case reviewed with ER doctor.  Discontinue IVC.  Disposition: No evidence of imminent risk to self or others at present.   Patient does not meet criteria for psychiatric inpatient admission. Supportive therapy provided about ongoing stressors. Discussed crisis plan, support from social network, calling 911, coming to the Emergency Department, and calling Suicide Hotline.  [facility], MD 09/23/2020 4:26 PM

## 2020-09-24 ENCOUNTER — Telehealth: Payer: Self-pay

## 2020-09-24 LAB — URINE CULTURE: Culture: <10000 — AB

## 2020-09-24 NOTE — Telephone Encounter (Signed)
Called to discuss with patient about COVID-19 symptoms and the use of one of the available treatments for those with mild to moderate Covid symptoms and at a high risk of hospitalization.  Pt appears to qualify for outpatient treatment due to co-morbid conditions and/or a member of an at-risk group in accordance with the FDA Emergency Use Authorization.    Symptom onset: ? Cough,congestion Vaccinated: x1 Booster? Unknown Immunocompromised? No Qualifiers: None NIH Criteria: Tier 1  Unable to reach pt - Unable to leave message.   Esther Hardy

## 2020-09-25 ENCOUNTER — Other Ambulatory Visit: Payer: Self-pay

## 2020-09-25 MED ORDER — GABAPENTIN 400 MG PO CAPS
400.0000 mg | ORAL_CAPSULE | Freq: Three times a day (TID) | ORAL | 0 refills | Status: AC
  Filled 2020-09-25: qty 90, 30d supply, fill #0

## 2020-09-25 MED ORDER — TRAZODONE HCL 100 MG PO TABS
100.0000 mg | ORAL_TABLET | Freq: Every evening | ORAL | 0 refills | Status: AC | PRN
  Filled 2020-09-25: qty 30, 30d supply, fill #0

## 2020-09-25 MED ORDER — FLUOXETINE HCL 40 MG PO CAPS
40.0000 mg | ORAL_CAPSULE | Freq: Every day | ORAL | 0 refills | Status: AC
  Filled 2020-09-25: qty 30, 30d supply, fill #0

## 2020-09-25 MED ORDER — QUETIAPINE FUMARATE 400 MG PO TABS
400.0000 mg | ORAL_TABLET | Freq: Every evening | ORAL | 0 refills | Status: AC
  Filled 2020-09-25: qty 30, 30d supply, fill #0

## 2020-09-25 MED ORDER — BUSPIRONE HCL 15 MG PO TABS
15.0000 mg | ORAL_TABLET | Freq: Three times a day (TID) | ORAL | 0 refills | Status: AC
  Filled 2020-09-25: qty 90, 30d supply, fill #0

## 2020-09-26 ENCOUNTER — Encounter: Payer: Medicaid Other | Admitting: Obstetrics and Gynecology

## 2020-10-14 ENCOUNTER — Other Ambulatory Visit: Payer: Self-pay

## 2020-10-14 ENCOUNTER — Ambulatory Visit: Payer: BLUE CROSS/BLUE SHIELD | Admitting: Pharmacy Technician

## 2020-10-14 DIAGNOSIS — Z79899 Other long term (current) drug therapy: Secondary | ICD-10-CM

## 2020-10-14 NOTE — Progress Notes (Signed)
Showing that patient has [employer] coverage with pharmacy benefits.  Member# [id number].  Verified with [employer] that policy is still active.  Patient does not meet [facility]'s eligibility criteria.  Patient will need to provide a termination letter from [facility] to continue to receive medication assistance at [facility].  Kathie Rhodes. Posey Boyer Care Manager [facility]

## 2020-12-26 ENCOUNTER — Other Ambulatory Visit: Payer: Self-pay

## 2023-04-23 IMAGING — US US OB < 14 WEEKS - US OB TV
1 series · 13 of 28 positions shown · non-contrast
Comparison: None available.

CLINICAL DATA: Initial evaluation for acute left adnexal pain for 1
day. Early pregnancy.

EXAM:
OBSTETRIC <14 WK US AND TRANSVAGINAL OB US
TECHNIQUE: Both transabdominal and transvaginal ultrasound examinations were
performed for complete evaluation of the gestation as well as the
maternal uterus, adnexal regions, and pelvic cul-de-sac.
Transvaginal technique was performed to assess early pregnancy.

[Series 1: ob us · 13 of 104 slices shown]
[im 4/104]
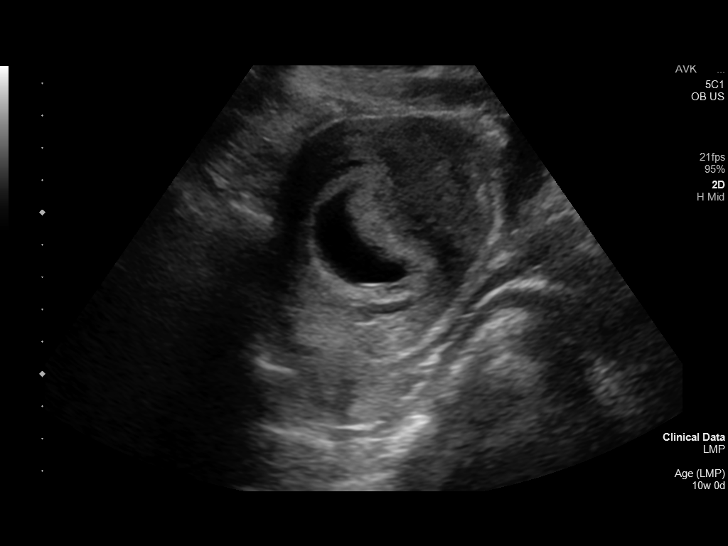
[im 12/104]
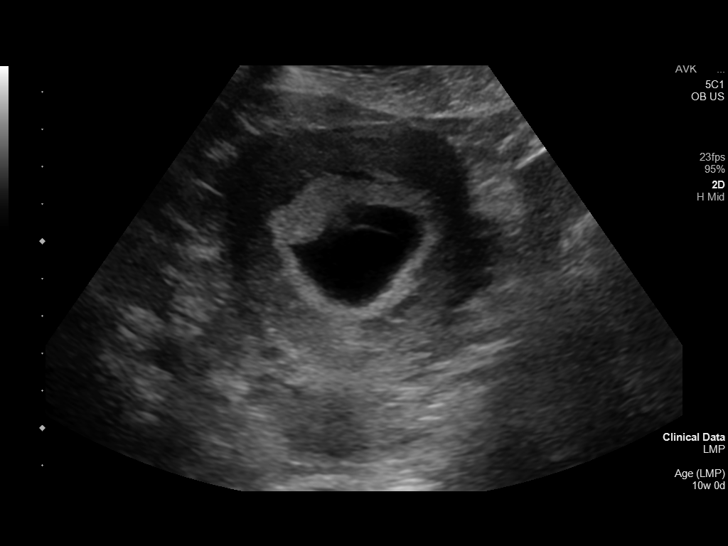
[im 20/104]
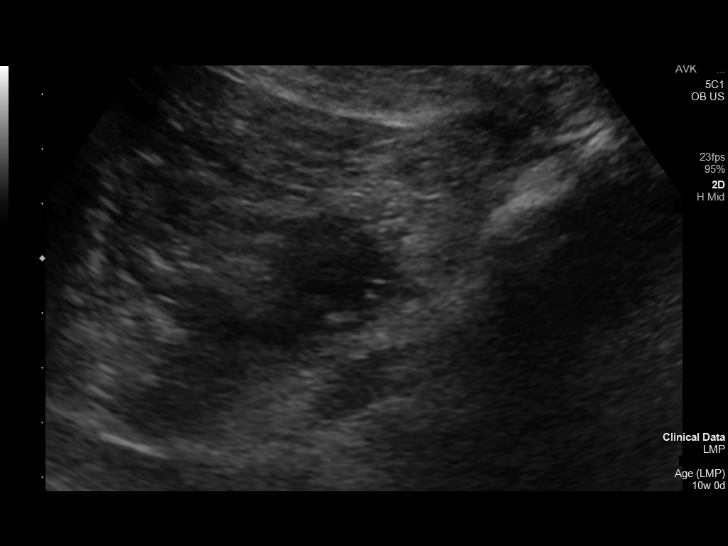
[im 27/104]
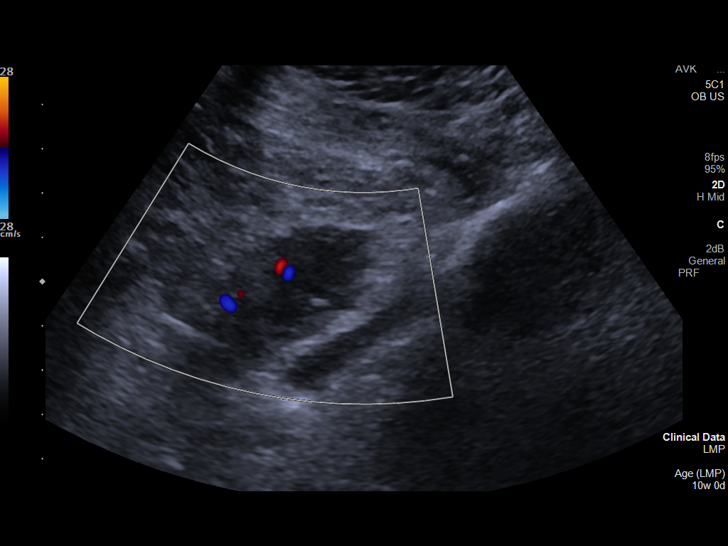
[im 35/104]
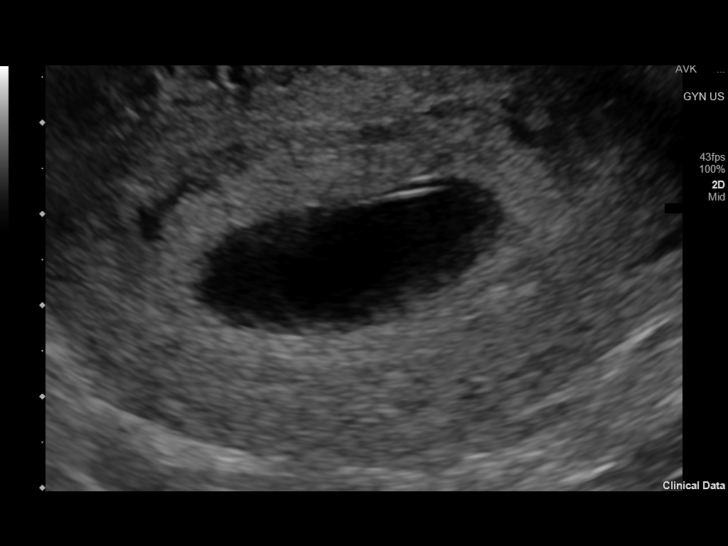
[im 42/104]
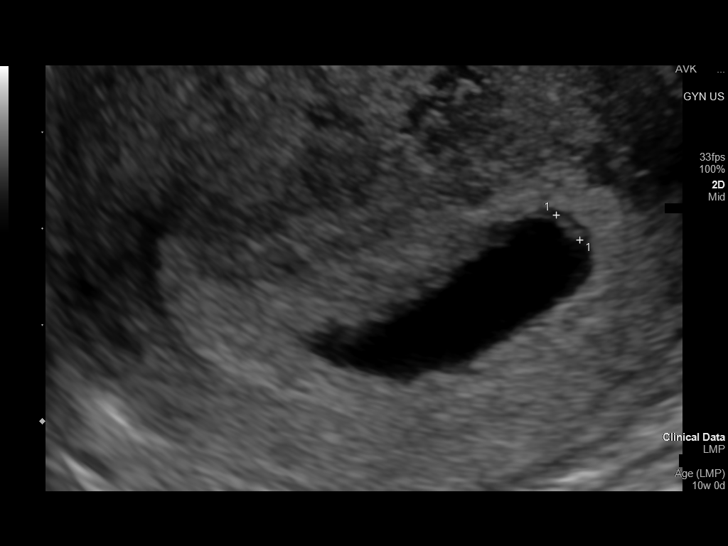
[im 54/104]
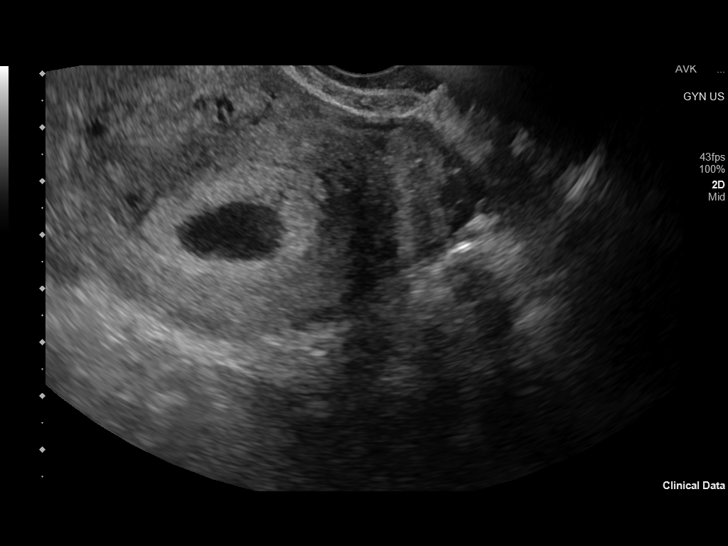
[im 62/104]
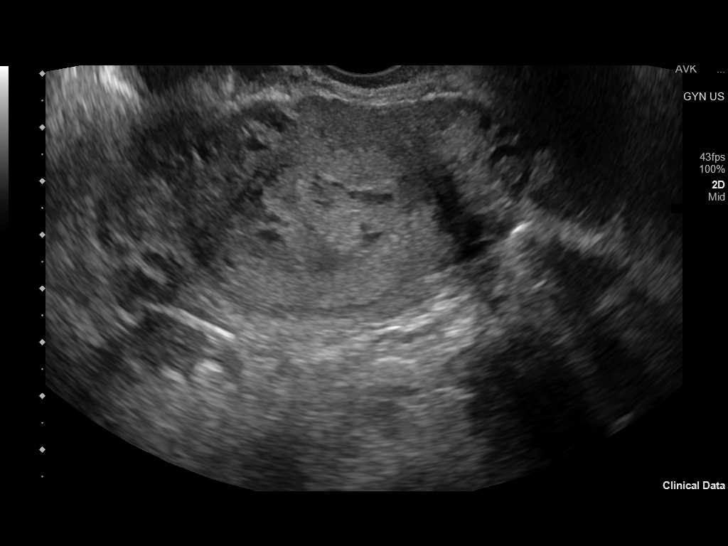
[im 69/104]
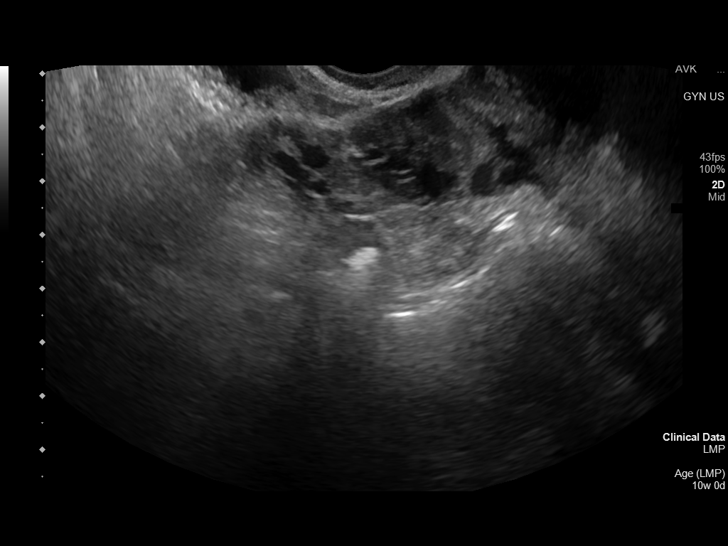
[im 77/104]
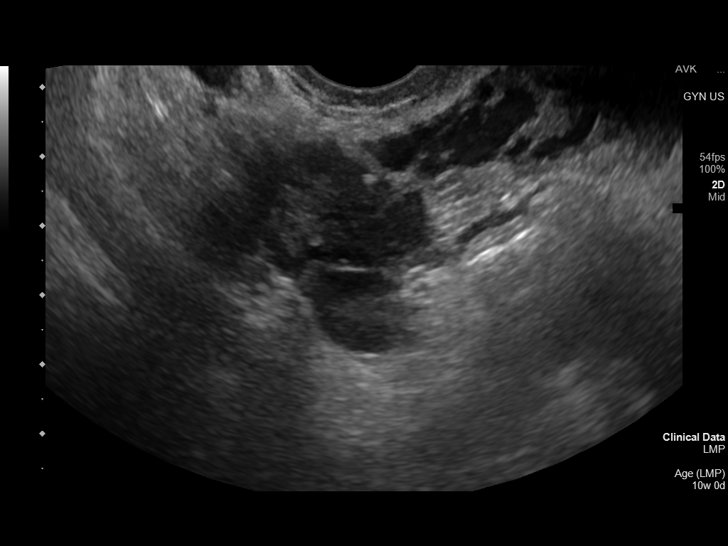
[im 84/104]
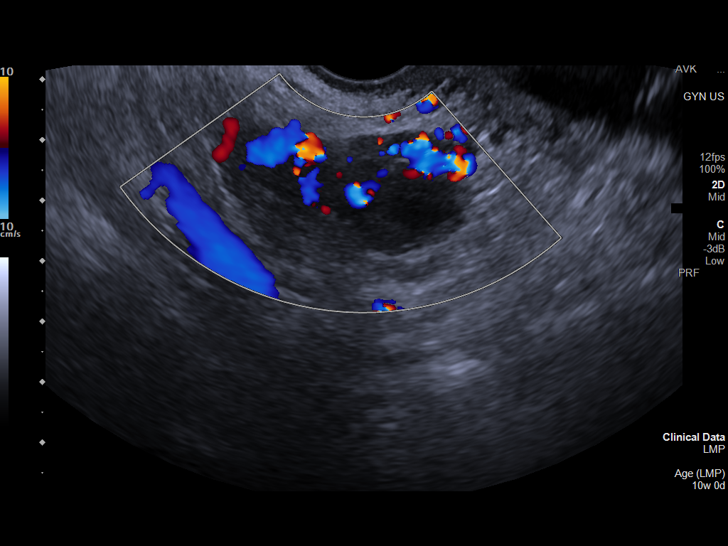
[im 92/104]
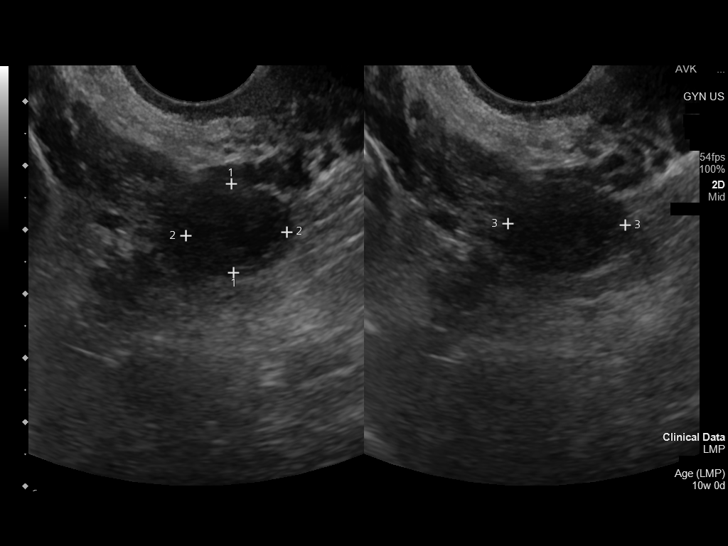
[im 100/104]
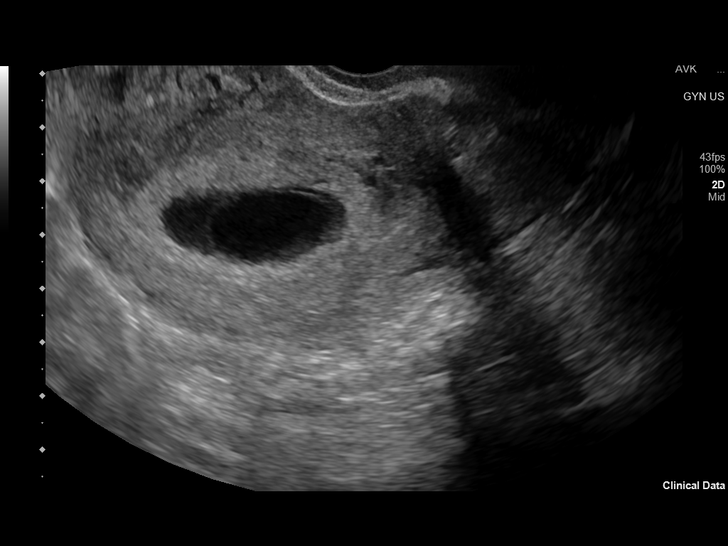

[13 of 28 positions shown; findings below may reference images not displayed]

FINDINGS: Intrauterine gestational sac: Single

Yolk sac:  Negative.

Embryo:  Present.

Cardiac Activity: Negative.

Heart Rate: N/A

CRL: 3.5 mm   6 w   0 d                  US EDC: 04/25/2021

Subchorionic hemorrhage: Small subchorionic hemorrhage without mass
effect.

Maternal uterus/adnexae: Ovaries within normal limits bilaterally.
1.8 cm degenerating corpus luteal cyst noted within the left ovary.
No adnexal mass. Small volume free fluid present within the pelvis.
IMPRESSION: 1. Single intrauterine pregnancy with internal embryo, but no
detectable cardiac activity. Crown-rump length measures 3.5 mm.
Findings are suspicious but not yet definitive for failed pregnancy.
Recommend follow-up US in 10-14 days for definitive diagnosis. This
recommendation follows SRU consensus guidelines: Diagnostic Criteria
for Nonviable Pregnancy Early in the First Trimester. N Engl J Med
1861; [DATE].
2. Small subchorionic hemorrhage without mass effect.
3. 1.8 cm degenerating left ovarian corpus luteal cyst with small
volume free fluid in the pelvis. Finding could contribute to acute
left adnexal pain.

## 2023-04-23 IMAGING — US US RENAL
1 series · 14 of 25 positions shown · non-contrast
Comparison: CT abdomen 02/16/2014

CLINICAL DATA: Flank pain

EXAM:
RENAL / URINARY TRACT ULTRASOUND COMPLETE

[Series 1: us renal · 14 of 46 slices shown]
[im 1/46]
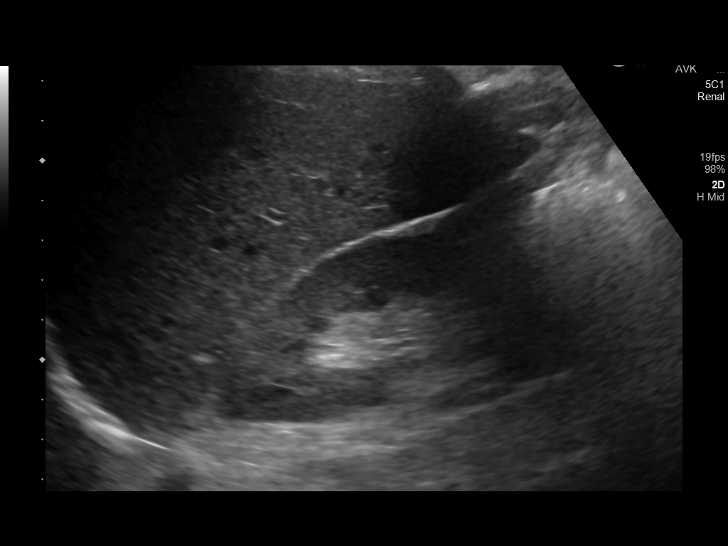
[im 4/46]
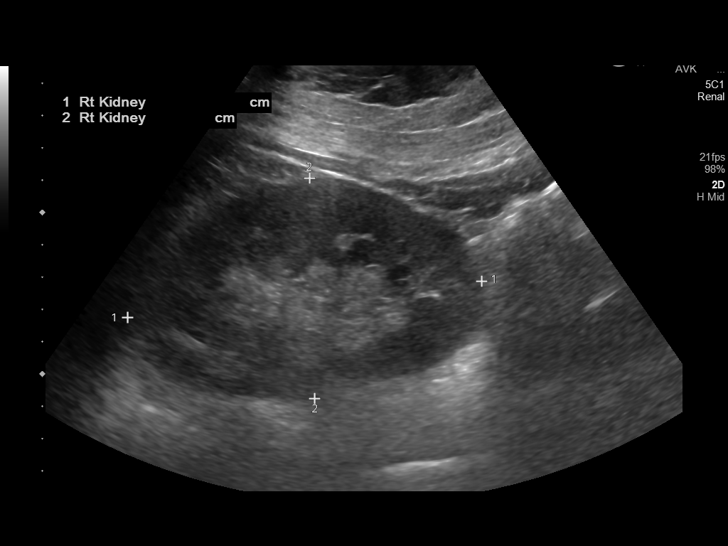
[im 8/46]
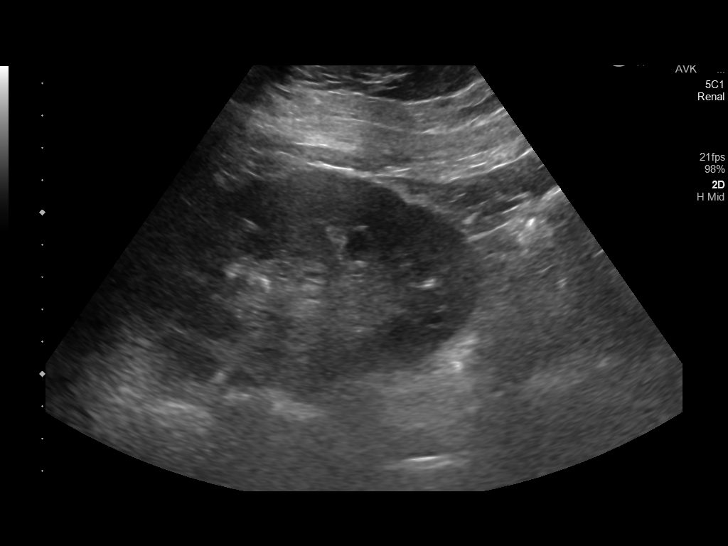
[im 12/46]
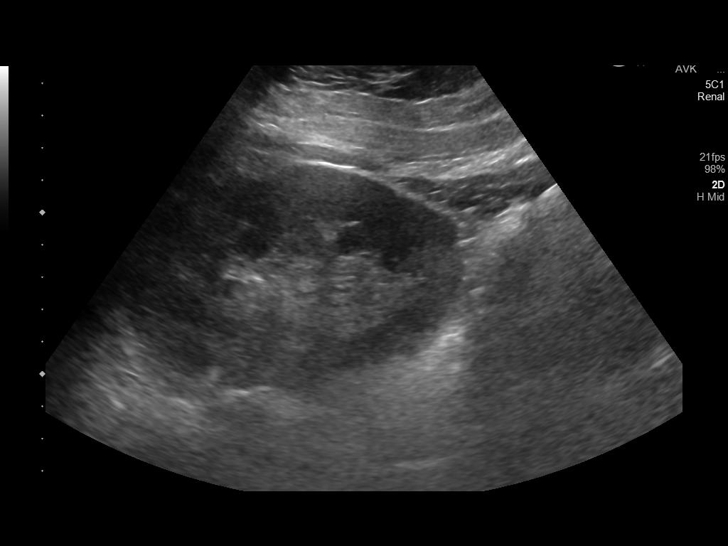
[im 16/46]
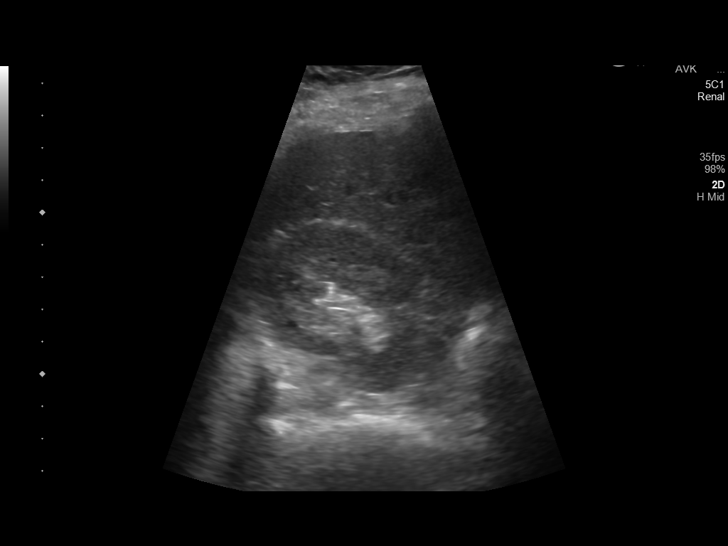
[im 17/46]
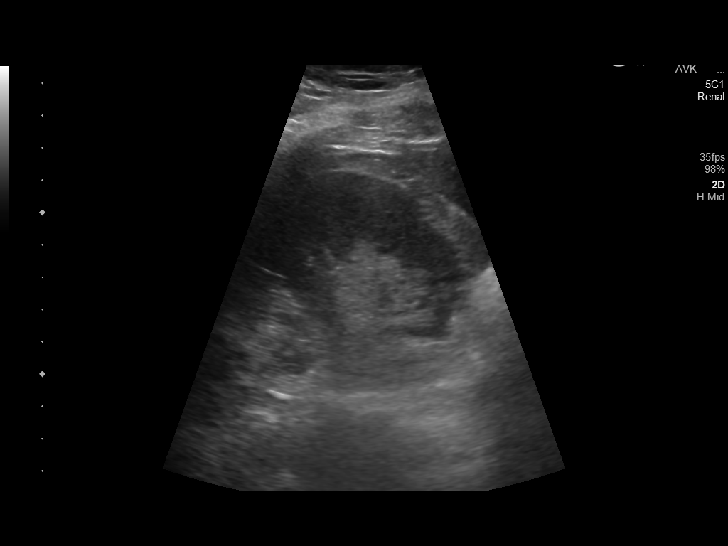
[im 21/46]
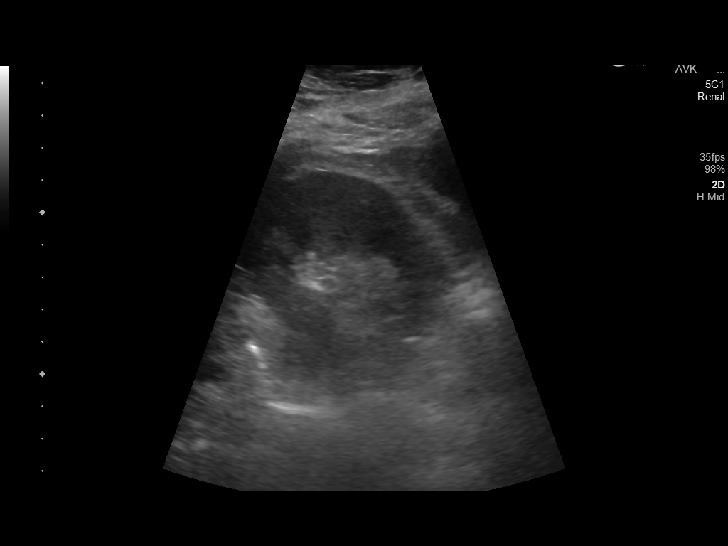
[im 25/46]
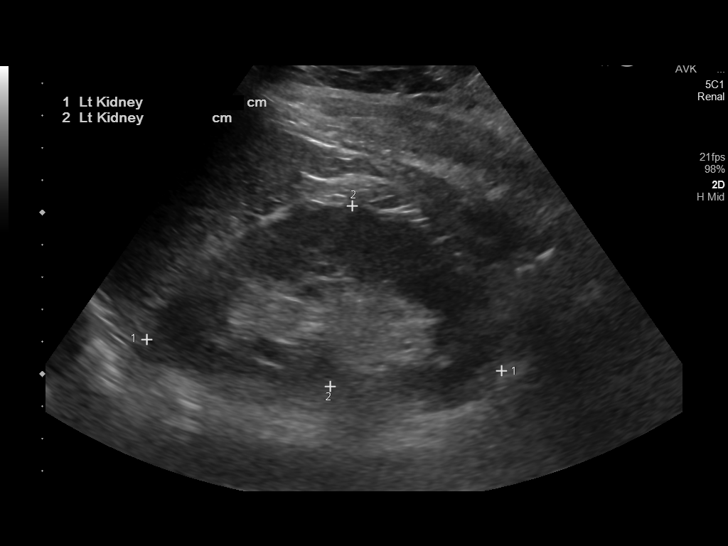
[im 29/46]
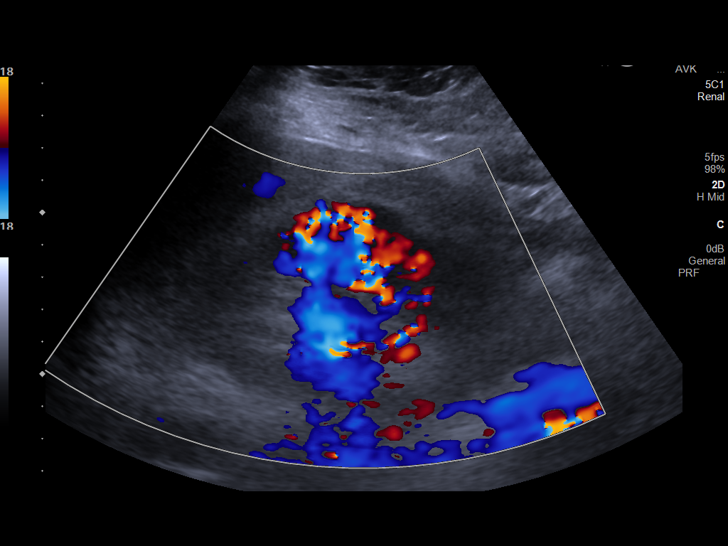
[im 31/46]
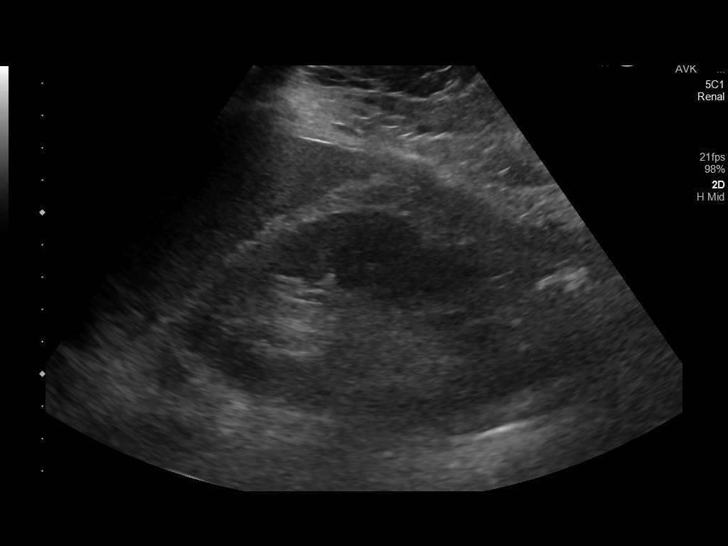
[im 34/46]
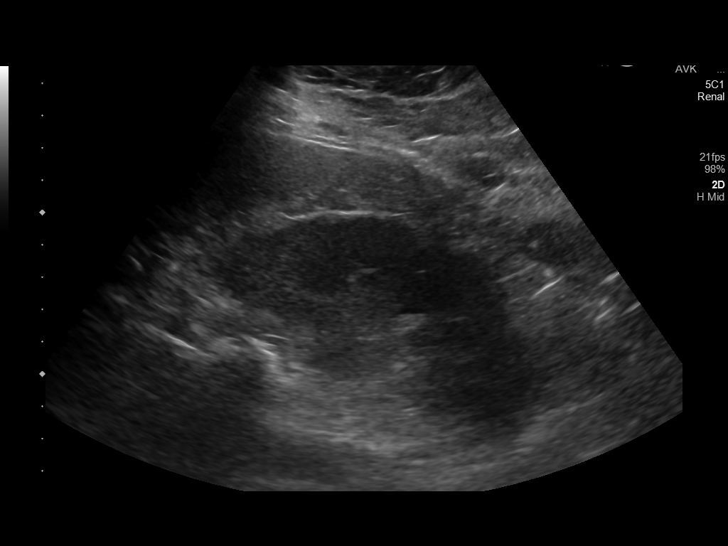
[im 38/46]
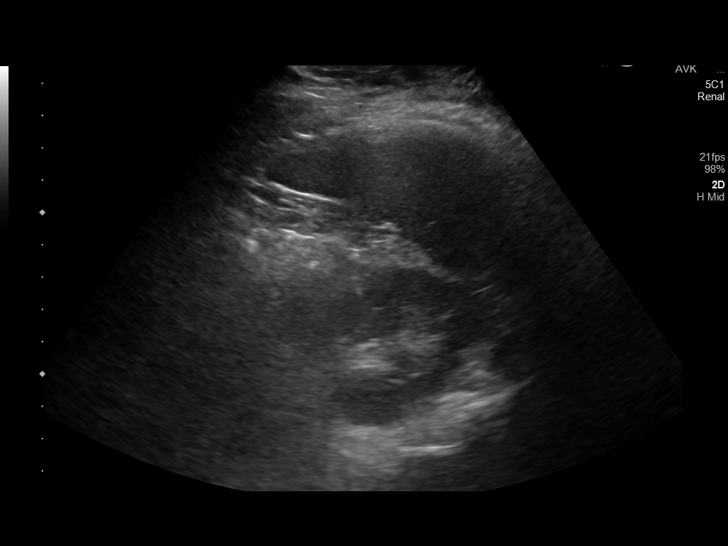
[im 42/46]
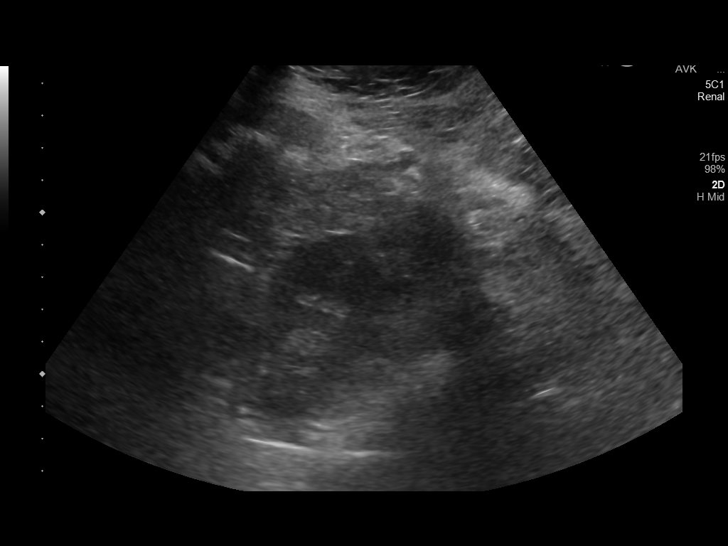
[im 46/46]
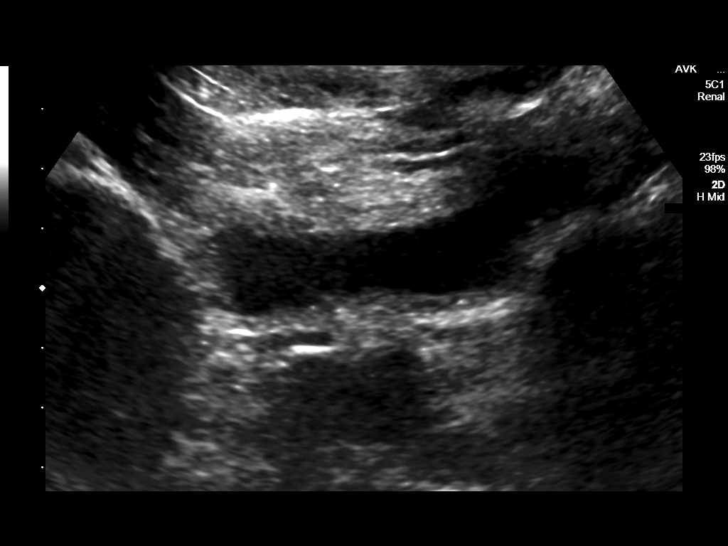

[14 of 25 positions shown; findings below may reference images not displayed]

FINDINGS: Right Kidney:

Renal measurements: 11 x 6.8 x 5 cm = volume: 195.6 mL. Echogenicity
within normal limits. No mass or hydronephrosis visualized.

Left Kidney:

Renal measurements: 11 x 5.6 x 5.1 cm = volume: 166.2 mL.
Echogenicity within normal limits. No mass or hydronephrosis
visualized.

Bladder:

Appears normal for degree of bladder distention.

Other:

None.
IMPRESSION: 1. Normal renal ultrasound.
